# Patient Record
Sex: Male | Born: 1955 | Race: White | Hispanic: No | Marital: Married | Smoking: Former smoker
Health system: Southern US, Community
[De-identification: ages and names within clinical notes are randomized; demographics above are authoritative.]

## PROBLEM LIST (undated history)

## (undated) DIAGNOSIS — K219 Gastro-esophageal reflux disease without esophagitis: Secondary | ICD-10-CM

## (undated) DIAGNOSIS — E079 Disorder of thyroid, unspecified: Secondary | ICD-10-CM

## (undated) DIAGNOSIS — E785 Hyperlipidemia, unspecified: Secondary | ICD-10-CM

## (undated) DIAGNOSIS — T7840XA Allergy, unspecified, initial encounter: Secondary | ICD-10-CM

## (undated) DIAGNOSIS — I10 Essential (primary) hypertension: Secondary | ICD-10-CM

## (undated) DIAGNOSIS — K449 Diaphragmatic hernia without obstruction or gangrene: Secondary | ICD-10-CM

## (undated) DIAGNOSIS — F1910 Other psychoactive substance abuse, uncomplicated: Secondary | ICD-10-CM

## (undated) DIAGNOSIS — N189 Chronic kidney disease, unspecified: Secondary | ICD-10-CM

## (undated) HISTORY — DX: Diaphragmatic hernia without obstruction or gangrene: K44.9

## (undated) HISTORY — PX: TONSILLECTOMY: SUR1361

## (undated) HISTORY — PX: BACK SURGERY: SHX140

## (undated) HISTORY — DX: Hyperlipidemia, unspecified: E78.5

## (undated) HISTORY — DX: Allergy, unspecified, initial encounter: T78.40XA

## (undated) HISTORY — PX: NECK SURGERY: SHX720

## (undated) HISTORY — DX: Chronic kidney disease, unspecified: N18.9

## (undated) HISTORY — DX: Other psychoactive substance abuse, uncomplicated: F19.10

## (undated) HISTORY — PX: HEPARIN ASSOCIATED ANTIBODY DETECTION (CONVERTED LAB): LAB15012

## (undated) HISTORY — DX: Disorder of thyroid, unspecified: E07.9

## (undated) HISTORY — PX: COLONOSCOPY: SHX174

## (undated) HISTORY — DX: Gastro-esophageal reflux disease without esophagitis: K21.9

## (undated) HISTORY — PX: ESOPHAGOGASTRODUODENOSCOPY: SHX1529

---

## 2000-10-30 ENCOUNTER — Encounter (INDEPENDENT_AMBULATORY_CARE_PROVIDER_SITE_OTHER): Payer: Self-pay | Admitting: Specialist

## 2000-10-30 ENCOUNTER — Ambulatory Visit (HOSPITAL_COMMUNITY): Admission: RE | Admit: 2000-10-30 | Discharge: 2000-10-30 | Payer: Self-pay | Admitting: Gastroenterology

## 2008-01-07 ENCOUNTER — Encounter: Admission: RE | Admit: 2008-01-07 | Discharge: 2008-01-07 | Payer: Self-pay | Admitting: Gastroenterology

## 2009-02-22 ENCOUNTER — Encounter: Admission: RE | Admit: 2009-02-22 | Discharge: 2009-02-22 | Payer: Self-pay | Admitting: Gastroenterology

## 2011-01-19 ENCOUNTER — Other Ambulatory Visit: Payer: Self-pay | Admitting: Gastroenterology

## 2011-01-19 DIAGNOSIS — B182 Chronic viral hepatitis C: Secondary | ICD-10-CM

## 2011-02-13 ENCOUNTER — Ambulatory Visit
Admission: RE | Admit: 2011-02-13 | Discharge: 2011-02-13 | Disposition: A | Discharge status: Home / Self Care | Payer: PRIVATE HEALTH INSURANCE | Source: Ambulatory Visit | Attending: Gastroenterology | Admitting: Gastroenterology

## 2011-02-13 DIAGNOSIS — B182 Chronic viral hepatitis C: Secondary | ICD-10-CM

## 2012-03-04 ENCOUNTER — Other Ambulatory Visit: Payer: Self-pay | Admitting: Gastroenterology

## 2012-03-04 DIAGNOSIS — B182 Chronic viral hepatitis C: Secondary | ICD-10-CM

## 2012-03-11 ENCOUNTER — Ambulatory Visit
Admission: RE | Admit: 2012-03-11 | Discharge: 2012-03-11 | Disposition: A | Discharge status: Home / Self Care | Payer: PRIVATE HEALTH INSURANCE | Source: Ambulatory Visit | Attending: Gastroenterology | Admitting: Gastroenterology

## 2012-03-11 DIAGNOSIS — B182 Chronic viral hepatitis C: Secondary | ICD-10-CM

## 2014-04-08 ENCOUNTER — Emergency Department (HOSPITAL_COMMUNITY)
Admission: EM | Admit: 2014-04-08 | Discharge: 2014-04-08 | Disposition: A | Discharge status: Home / Self Care | Payer: Worker's Compensation | Attending: Emergency Medicine | Admitting: Emergency Medicine

## 2014-04-08 ENCOUNTER — Emergency Department (HOSPITAL_COMMUNITY): Payer: Worker's Compensation

## 2014-04-08 ENCOUNTER — Encounter (HOSPITAL_COMMUNITY): Payer: Self-pay | Admitting: Family Medicine

## 2014-04-08 DIAGNOSIS — I10 Essential (primary) hypertension: Secondary | ICD-10-CM | POA: Insufficient documentation

## 2014-04-08 DIAGNOSIS — S199XXA Unspecified injury of neck, initial encounter: Secondary | ICD-10-CM | POA: Diagnosis not present

## 2014-04-08 DIAGNOSIS — S0001XA Abrasion of scalp, initial encounter: Secondary | ICD-10-CM | POA: Diagnosis not present

## 2014-04-08 DIAGNOSIS — Y998 Other external cause status: Secondary | ICD-10-CM | POA: Diagnosis not present

## 2014-04-08 DIAGNOSIS — Y9389 Activity, other specified: Secondary | ICD-10-CM | POA: Diagnosis not present

## 2014-04-08 DIAGNOSIS — Y9289 Other specified places as the place of occurrence of the external cause: Secondary | ICD-10-CM | POA: Diagnosis not present

## 2014-04-08 DIAGNOSIS — Z87891 Personal history of nicotine dependence: Secondary | ICD-10-CM | POA: Insufficient documentation

## 2014-04-08 DIAGNOSIS — W19XXXA Unspecified fall, initial encounter: Secondary | ICD-10-CM

## 2014-04-08 DIAGNOSIS — W11XXXA Fall on and from ladder, initial encounter: Secondary | ICD-10-CM | POA: Insufficient documentation

## 2014-04-08 DIAGNOSIS — S0990XA Unspecified injury of head, initial encounter: Secondary | ICD-10-CM | POA: Diagnosis present

## 2014-04-08 HISTORY — DX: Essential (primary) hypertension: I10

## 2014-04-08 MED ORDER — HYDROCODONE-ACETAMINOPHEN 5-325 MG PO TABS: 1.0000 | ORAL_TABLET | ORAL | Status: DC | PRN

## 2014-04-08 MED ORDER — HYDROCODONE-ACETAMINOPHEN 5-325 MG PO TABS
2.0000 | ORAL_TABLET | Freq: Once | ORAL | Status: AC
  Administered 2014-04-08: 2 via ORAL
  Filled 2014-04-08: qty 2

## 2014-04-08 MED ORDER — CYCLOBENZAPRINE HCL 10 MG PO TABS: 10.0000 mg | ORAL_TABLET | Freq: Two times a day (BID) | ORAL | Status: DC | PRN

## 2014-04-08 NOTE — Discharge Instructions (Signed)
Please follow the directions provided.  Use the referral or the resource guide provided to establish care with a primary care provider for a follow-up appointment to ensure you are getting better.  Take your pain medicine as needed and rest.  You will probably be more sore tomorrow. Keep your wounds clean and dry.  They are superficial and should heal well. However, don't hesitate to return for any new, worsening or concerning symptoms.     WHEN SHOULD I SEEK IMMEDIATE MEDICAL CARE?  You should get help right away if:  You have confusion or drowsiness.  You feel sick to your stomach (nauseous) or have continued, forceful vomiting.  You have dizziness or unsteadiness that is getting worse.  You have severe, continued headaches not relieved by medicine. Only take over-the-counter or prescription medicines for pain, fever, or discomfort as directed by your health care provider.  You do not have normal function of the arms or legs or are unable to walk.  You notice changes in the black spots in the center of the colored part of your eye (pupil).  You have a clear or bloody fluid coming from your nose or ears.  You have a loss of vision. During the next 24 hours after the injury, you must stay with someone who can watch you for the warning signs. This person should contact local emergency services (911 in the U.[place] if you have seizures, you become unconscious, or you are unable to wake up.   Emergency Department Resource Guide 1) Find a Doctor and Pay Out of Pocket Although you won't have to find out who is covered by your insurance plan, it is a good idea to ask around and get recommendations. You will then need to call the office and see if the doctor you have chosen will accept you as a new patient and what types of options they offer for patients who are self-pay. Some doctors offer discounts or will set up payment plans for their patients who do not have insurance, but you will need to ask so you  aren't surprised when you get to your appointment.  2) Contact Your Local Health Department Not all health departments have doctors that can see patients for sick visits, but many do, so it is worth a call to see if yours does. If you don't know where your local health department is, you can check in your phone book. The CDC also has a tool to help you locate your state's health department, and many state websites also have listings of all of their local health departments.  3) Find a Walk-in Clinic If your illness is not likely to be very severe or complicated, you may want to try a walk in clinic. These are popping up all over the country in pharmacies, drugstores, and shopping centers. They're usually staffed by nurse practitioners or physician assistants that have been trained to treat common illnesses and complaints. They're usually fairly quick and inexpensive. However, if you have serious medical issues or chronic medical problems, these are probably not your best option.  No Primary Care Doctor: - Call Health Connect at  [phone] - they can help you locate a primary care doctor that  accepts your insurance, provides certain services, etc. - Physician Referral Service- [phone]  Chronic Pain Problems: Organization         Address  Phone   Notes  [facility]  [phone]) [phone] Patients need to be referred by their primary care  doctor.   Medication Assistance: Organization         Address  Phone   Notes  [facility] Medication [facility] [address] [place] [place], [place] [address] [phone] --Must be a resident of [facility] -- Must have NO insurance coverage whatsoever (no Medicaid/ Medicare, etc.) -- The pt. MUST have a primary care doctor that directs their care regularly and follows them in the community   Med[facility]ist  [phone]   [employer]  [phone]    Agencies that provide inexpensive medical care: Organization          Address  Phone   Notes  [facility]  [phone]   [facility]    [phone]   [facility] [address] [place], [place] [address] [phone]   [facility] [address], [place] [phone]   Planned Parenthood    [phone]   [facility]    [phone]   [facility]  [address], [place] Phone:  [phone], Fax:  [phone] Hours of Operation:  9 am - 6 pm, M-F.  Also accepts Medicaid/Medicare and self-pay.  [facility]  [address], [place] Phone: [phone], Fax: [phone]. Hours of Operation:  8:30 am - 5:30 pm, M-F.  Also accepts Medicaid and self-pay.  [facility] [place] [address] [place] Phone: [phone]   [facility] [address] Natasha Bence [place], [place] [phone], Ext. [phone] Mondays & Thursdays: 7-9 AM.  First 15 patients are seen on a first come, first serve basis.    Medicaid-accepting [facility] Providers:  Organization         Address  Phone   Notes  [facility] [address], [place] [phone] Also accepts self-pay patients.  [facility] [address] Laurell Josephs [place], [place]  [phone]   [facility] [address], [place] [phone]   [facility] [address], [place] [phone]   Renaye Rakers [address], [place]   [phone] Only accepts [place] Access [place] patients after they have their name applied to their card.   Self-Pay (no insurance) in [facility]:  Organization         Address  Phone   Notes  Sickle Cell Patients, [facility] [address] [place], [place] [phone]   [facility] [address] [place], [place] [phone]    [facility] [place]  [address], [place] [phone]   [facility]/Dr. Osei-Bonsu  [address], [place] or [address], [place] [phone] Phone number for both [place] and [place] locations is the same.  [facility] [address] [place] [phone]   [facility] [address] [place] or [address] [phone]   [facility] [address], [place] [phone], phone; [phone], fax Sees patients 1st and 3rd Saturday of every month.  Must not qualify for public or private insurance (i.e. Medicaid, Medicare, [place] Health Choice, Veterans' Benefits)  Household income should be no more than 200% of the  poverty level The clinic cannot treat you if you are pregnant or think you are pregnant  Sexually transmitted diseases are not treated at the clinic.    Dental Care: Organization         Address  Phone  Notes  [facility] [address], [place] [phone]) [phone] Accepts children up to age 59 who are enrolled in [place] or [place] Health Choice; pregnant women with a Medicaid card; and children who have applied for Medicaid or [place] Health Choice, but were declined, whose parents can pay a reduced fee at time of service.  [facility] [place]  [address], [place] [phone]) [phone] Accepts children up to age 59 who are enrolled in [place] or [place] Health Choice; pregnant women with a Medicaid card; and children who have applied for Medicaid or [place] Health Choice, but were declined, whose parents can pay a reduced fee at time of service.  [facility]  [address], [place] [phone]) [phone] Patients are seen by appointment only. Walk-ins are not accepted. [facility] will see patients 5618  years of age and older. Monday - Tuesday (8am-5pm) Most Wednesdays (8:30-5pm) $30 per visit, cash only  [facility]  [address], [facility] [phone]) [phone] Patients are seen by appointment only. Walk-ins are not accepted. [facility] will see patients 59 years of age and older. One Wednesday Evening (Monthly: Volunteer Based).  $30 per visit, cash only  [facility] [facility]  [phone] for adults; Children under age 464, call Graduate Pediatric Dentistry at [phone]. Children aged 324-14, please call [phone]) [phone] to request a pediatric application.  Dental services are provided in all areas of dental care including fillings, crowns and bridges, complete and partial dentures, implants, gum treatment, root canals, and extractions. Preventive care is also provided. Treatment is provided to both adults and children. Patients are selected via a lottery and there is often a waiting list.   [facility] [address], [place]  [phone] [email or web address]   [facility] [address], [place], [place] [phone], Ext. [phone] Second and Fourth Thursday of each month, opens at 6:30 AM; Clinic ends at 9 AM.  Patients are seen on a first-come first-served basis, and a limited number are seen during each clinic.   [facility]  [address], [place], [place] [phone]   Eligibility Requirements You must have lived in [place], or [place] counties for at least the last three months.   You cannot be eligible for state or federal sponsored [place]healthcare insurance, including CIGNAVeterans Administration, [place]icaid, or [employer]icare.   You generally cannot be eligible for healthcare insurance through your employer.    How to apply: Eligibility screenings are held every Tuesday and Wednesday afternoon from 1:00 pm until 4:00 pm. You do not need an appointment for the interview!  [facility]  [address], [place], [place] [phone]   [facility]  [phone]   [facility]  [phone]   [facility]  [phone]    Behavioral Health Resources in the Community: Intensive Outpatient Programs Organization         Address  Phone  Notes  [facility] [place] [place] [facility] [address], [place], [place] [phone]   [facility] [address] [place], [place] [phone]   [facility] [address], [place], [place]  [phone]   [facility] [address]  [place], [place] [phone] or [phone]   Substance Abuse Resources Organization         Address  Phone  Notes  [facility] Drug Services  [phone]   [facility]  [phone]   [facility] [place]  [phone]   Floydene Flock  [phone]   Residential & Outpatient Substance Abuse Program  [phone]   Psychological Services Organization         Address  Phone  Notes  [facility]  [phone]   [facility]  [phone]   [facility] [address], [place] [phone] or [phone]    Mobile Crisis Teams Organization         Address  Phone  Notes  [facility], Mobile Crisis Care Unit  [phone]   [facility]  [address] [place], [place] [phone]   Doristine Locks [address] [place] [phone]    Self-Help/Support Groups Organization         Address  Phone             Notes  [facility] [place] - variety of support groups  [phone]- I7437963 Call for more information  Narcotics Anonymous (NA), [facility] [address], [employer] [place]  2 meetings at this location   Statistician         Address  Phone  Notes  [facility] [address],    [place]   [phone]   [facility]  [address] [place] [address], [place], [place] [phone]   [facility] [address] [place], [place] [phone] Admissions: 8am-3pm M-F  [facility] [address],    [place], [place] [phone]   [facility] [address] [place], [place], [place] [phone]   [facility] [address]  [place], [place] [phone]   [facility] - Intensive Outpatient [address] Laurell Josephs [address], [place], [place] [phone]   [facility] ([facility] [address] [place].,  [place], [place] [phone] or [phone]   [facility] ([facility]) [address] [place], [place] [phone] Accepts Medicaid  [facility] [address]  [place] [phone] Substance Abuse/Addiction Treatment   [facility] Organization         Address  Phone  Notes  [facility]  [phone]   Angie Fava, PhD [address] Ervin Knack [place], [place]   [phone] or [phone]   [facility]   [address] [place], [place] [phone]   [facility] [address], [place], [place] [phone] Insurance/Medicaid/sponsorship through [facility] and Families [address]                                    [place], [place] [phone] Therapy/tele-psych/case  [facility] [address], [place] [phone]    Dr. Lolly Mustache  [phone]   [facility]  [facility] Dept. 1) [address] [place] 2) [address] [place] 3)  [address], [place] [phone])  [phone]) [phone]  [phone](336) [phone]   [place] [facility] [place] Child Abuse Hotline [phone]) [phone] or [phone] (After Hours)

## 2014-04-08 NOTE — ED Provider Notes (Signed)
CSN: [id number]     Arrival date & time 04/08/14  1216 History   First MD Initiated Contact with Patient 04/08/14 1342     Chief Complaint  Patient presents with  . Fall  . Headache  . Neck Pain   (Consider location/radiation/quality/duration/timing/severity/associated sxs/prior Treatment) HPI  Mathew Wagner is a 58 yo male presenting with report of fall from ladder this am.  He states appr 2 hrs PTA he was standing near the top of a 10 foot ladder changing a light.  He states he tried to moves his feet over to reach farther and he fell from the ladder. Hitting his head and the fifth finger on his left hand on the ladder before landing on the floor.  He currently complains of neck pain and a mild frontal headache. He describes his pain as aching and rates it as 8/10.  He denies any light-headedness, chest pain, focal neuro deficit or LOC before or after the fall.  Past Medical History  Diagnosis Date  . Hypertension    Past Surgical History  Procedure Laterality Date  . Back surgery    . Tonsillectomy     History reviewed. No pertinent family history. History  Substance Use Topics  . Smoking status: Former Games developermoker  . Smokeless tobacco: Not on file  . Alcohol Use: Not on file    Review of Systems  Constitutional: Negative for fever and chills.  HENT: Negative for sore throat.   Eyes: Negative for visual disturbance.  Respiratory: Negative for cough and shortness of breath.   Cardiovascular: Negative for chest pain and leg swelling.  Gastrointestinal: Negative for nausea, vomiting and diarrhea.  Genitourinary: Negative for dysuria.  Musculoskeletal: Positive for myalgias.  Skin: Positive for wound. Negative for rash.  Neurological: Positive for headaches. Negative for weakness and numbness.    Allergies  Review of patient's allergies indicates no known allergies.  Home Medications   Prior to Admission medications   Not on File   BP 160/99 mmHg  Pulse 117   Temp(Src) 98 F (36.7 C) (Oral)  Resp 16  SpO2 96% Physical Exam  Constitutional: He appears well-developed and well-nourished. No distress.  HENT:  Head: Normocephalic. Head is with abrasion. Head is without raccoon's eyes, without Battle's sign and without contusion.    Mouth/Throat: Oropharynx is clear and moist. No oropharyngeal exudate.  Eyes: Conjunctivae are normal.  Neck: Normal range of motion and full passive range of motion without pain. Neck supple. Muscular tenderness present. No rigidity. No thyromegaly present.    Cardiovascular: Normal rate, regular rhythm and intact distal pulses.   Pulmonary/Chest: Effort normal and breath sounds normal. No respiratory distress. He has no wheezes. He has no rales. He exhibits no tenderness.  Abdominal: Soft. There is no tenderness.  Musculoskeletal: He exhibits no tenderness.       Hands: Lymphadenopathy:    He has no cervical adenopathy.  Neurological: He is alert.  Skin: Skin is warm and dry. No rash noted. He is not diaphoretic.  Psychiatric: He has a normal mood and affect.  Nursing note and vitals reviewed.   ED Course  Procedures (including critical care time) Labs Review Labs Reviewed - No data to display  Imaging Review Dg Cervical Spine Complete  04/08/2014   CLINICAL DATA:  58 year old male status post fall from ladder 10 feet today. Head bleeding, headache and neck pain. Initial encounter.  EXAM: CERVICAL SPINE  4+ VIEWS  COMPARISON:  None.  FINDINGS: Straightening of cervical  lordosis. Normal prevertebral soft tissue contour. C6-C7 interbody ankylosis. Cervicothoracic junction alignment is within normal limits. Disc space loss and moderate endplate spurring at C5-C6. Bilateral posterior element alignment is within normal limits. Moderate facet hypertrophy at C4-C5 and C5-C6. Negative lung apices. C1-C2 alignment and odontoid within normal limits.  IMPRESSION: 1. No acute fracture or listhesis identified in the cervical  spine. Ligamentous injury is not excluded. 2. 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   Electronically Signed   By: Augusto GambleLee  Hall M.D.   On: 04/08/2014 15:35     EKG Interpretation None      MDM   Final diagnoses:  Fall   58 yo with neck pain and muscle soreness after fall from ladder.  He hit his head with the fall but he has no report of LOC, and other than small abrasions to the the occipital scalp, no other signs of head injury and normal neuro exam.  His c-spine xray is negative for acute abnormality and his pain is managed in the ED.  RICE protocol and pain medicine indicated and discussed with patient.  Pt is well-appearing, in no acute distress and vital signs are stable.  They appear safe to be discharged.  Discharge include resources to establish cere with a PCP for a follow-up.  Return precautions provided. Pt verbalized understanding and in agreement.    Filed Vitals:   04/08/14 1445 04/08/14 1500 04/08/14 1621 04/08/14 1630  BP: 146/85 147/81 128/85 133/73  Pulse: 99 98 91 87  Temp:      TempSrc:      Resp: 16 16 16    SpO2: 97% 96% 99% 95%   Meds given in ED:  Medications  HYDROcodone-acetaminophen (NORCO/VICODIN) 5-325 MG per tablet 2 tablet (2 tablets Oral Given 04/08/14 1419)    Discharge Medication List as of 04/08/2014  4:29 PM    START taking these medications   Details  cyclobenzaprine (FLEXERIL) 10 MG tablet Take 1 tablet (10 mg total) by mouth 2 (two) times daily as needed for muscle spasms., Starting 04/08/2014, Until Discontinued, Print    HYDROcodone-acetaminophen (NORCO/VICODIN) 5-325 MG per tablet Take 1-2 tablets by mouth every 4 (four) hours as needed for moderate pain or severe pain., Starting 04/08/2014, Until Discontinued, Print           Harle BattiestElizabeth Susen Haskew, NP 04/08/14 2109  Flint MelterElliott L [place], MD 04/12/14 [phone]

## 2014-04-08 NOTE — ED Notes (Signed)
Per pt sts fell 8 to 10 feet off a ladder. sts hit head on something not sure. Denies LOC. Pt had some bleeding to head but currently controlled. sts some neck pain and slight HA.

## 2017-02-07 LAB — HM DEXA SCAN: HM Dexa Scan: NORMAL

## 2019-01-02 ENCOUNTER — Telehealth (INDEPENDENT_AMBULATORY_CARE_PROVIDER_SITE_OTHER): Payer: BC Managed Care – PPO | Admitting: Gastroenterology

## 2019-01-02 ENCOUNTER — Other Ambulatory Visit: Payer: Self-pay

## 2019-01-02 VITALS — Ht 72.0 in | Wt 200.0 lb

## 2019-01-02 DIAGNOSIS — R1310 Dysphagia, unspecified: Secondary | ICD-10-CM | POA: Diagnosis not present

## 2019-01-02 DIAGNOSIS — Z86010 Personal history of colonic polyps: Secondary | ICD-10-CM

## 2019-01-02 DIAGNOSIS — K219 Gastro-esophageal reflux disease without esophagitis: Secondary | ICD-10-CM

## 2019-01-02 DIAGNOSIS — R1319 Other dysphagia: Secondary | ICD-10-CM

## 2019-01-02 NOTE — Addendum Note (Signed)
Addended by: Herma Mering D on: 01/02/2019 04:59 PM   Modules accepted: Orders

## 2019-01-02 NOTE — Patient Instructions (Signed)
If you are age 64 or older, your body mass index should be between 23-30. Your Body mass index is 27.12 kg/m. If this is out of the aforementioned range listed, please consider follow up with your Primary Care Provider.  If you are age 61 or younger, your body mass index should be between 19-25. Your Body mass index is 27.12 kg/m. If this is out of the aformentioned range listed, please consider follow up with your Primary Care Provider.   To help prevent the possible spread of infection to our patients, communities, and staff; we will be implementing the following measures:  As of now we are not allowing any visitors/family members to accompany you to any upcoming appointments with [facility]. If you have any concerns about this please contact our office to discuss prior to the appointment.   We have sent the following medications to your pharmacy for you to pick up at your convenience: Protonix 40mg  daily 30 minutes before breakfast.  Your provider has requested that you go to the basement level for lab work at our [place] location ([address] [place] [place] [address]) . Press "B" on the elevator. The lab is located at the first door on the left as you exit the elevator. You may go at whatever time is convienent for you. The current hours of operations are Monday- Friday 7:30am-4:30pm.  You have been scheduled for a Barium Esophogram at [facility] (1st floor of the hospital) on 01/07/2019 at 11:00am. Please arrive 15 minutes prior to your appointment for registration. Make certain not to have anything to eat or drink 3 hours prior to your test. If you need to reschedule for any reason, please contact radiology at [phone] to do so. __________________________________________________________________ A barium swallow is an examination that concentrates on views of the esophagus. This tends to be a double contrast exam (barium and two liquids which, when combined, create a  gas to distend the wall of the oesophagus) or single contrast (non-ionic iodine based). The study is usually tailored to your symptoms so a good history is essential. Attention is paid during the study to the form, structure and configuration of the esophagus, looking for functional disorders (such as aspiration, dysphagia, achalasia, motility and reflux) EXAMINATION You may be asked to change into a gown, depending on the type of swallow being performed. A radiologist and radiographer will perform the procedure. The radiologist will advise you of the type of contrast selected for your procedure and direct you during the exam. You will be asked to stand, sit or lie in several different positions and to hold a small amount of fluid in your mouth before being asked to swallow while the imaging is performed .In some instances you may be asked to swallow barium coated marshmallows to assess the motility of a solid food bolus. The exam can be recorded as a digital or video fluoroscopy procedure. POST PROCEDURE It will take 1-2 days for the barium to pass through your system. To facilitate this, it is important, unless otherwise directed, to increase your fluids for the next 24-48hrs and to resume your normal diet.  This test typically takes about 30 minutes to perform. __________________________________________________________________________________  It has been recommended to you by your physician that you have a(n) EGD at [facility] completed. Per your request, we did not schedule the procedure(s) today. Please contact our office at [phone] to schedule in November 2020.  Thank you,  Dr. Jackquline Denmark

## 2019-01-02 NOTE — Progress Notes (Signed)
Chief Complaint: Dysphagia  Referring Provider: Gay Filler Davis/Dr. Cox      ASSESSMENT AND PLAN;  #1. GERD with esophageal dysphagia. D/d includes eso stricture, Schatzki's ring, motility disorder, eosinophilic esophagitis, pill induced esophagitis, r/o esophageal carcinoma or extrinsic lesions.  #2. H/O polyps (colon by Dr Earlean Shawl ?2013)  #3. ?Chronic Hep C (seen by Dr Earlean Shawl in past).  Plan: -Protonix 40 mg p.o. QD, 1/2 hr before breakfast. #30.  11 refills. -Barium swallow with barium tablet (get lat films as well) -EGD with eso bx and possibly dil and colon Nov (wants 8 AM anyday oct end).  Discussed risks and benefits including small but definite risks of eso perforation, bleeding, risks of anesthesia.  The benefits were also discussed. Consent forms given. -I have instructed patient to chew foods especially meats and breads well and eat slowly. -Please obtain previous records. (Dr Earlean Shawl) -Check CBC, CMP, Hep C geotype and viral load -FU after the above.  Thereafter he would need US abdomen and likely [place] screening.      HPI:    Mathew Wagner is a 63 y.o. male  Seen as an emergency workin Has been having dysphagia mostly to solids x 6 months, intermittent, not getting any worse, associated with heartburn, food getting hung up in the upper chest.  No history of self-induced vomiting.  No history of endoscopic disimpaction. Denies having any weight loss No melena or hematochezia.  Denies having any lower GI symptoms including diarrhea or constipation.  He underwent colonoscopy likely in 2013 when he had small polyps removed.  He was told that he was on a "five-year plan".  No fever chills or night sweats.  No nausea vomiting.  Wife having Sx knee sept 29.  Hence, he would like to wait for endoscopic procedures. Mathew Wagner is his brother Past Medical History:  Diagnosis Date  . GERD (gastroesophageal reflux disease)   . Hypertension   . Thyroid disease     Past  Surgical History:  Procedure Laterality Date  . BACK SURGERY     L4 and L5 possibly  . COLONOSCOPY     Dr Earlean Shawl- 2013  . ESOPHAGOGASTRODUODENOSCOPY     Dr Lyndel Safe- possibly [phone]  . NECK SURGERY     1990's.   . TONSILLECTOMY      Family History  Problem Relation Age of Onset  . Lung cancer Father   . Lung cancer Sister   . Colon cancer Neg Hx   . Esophageal cancer Neg Hx     Social History   Tobacco Use  . Smoking status: Former Research scientist (life sciences)  . Smokeless tobacco: Never Used  . Tobacco comment: quit about 20 years ago  Substance Use Topics  . Alcohol use: Not Currently  . Drug use: Not Currently    Current Outpatient Medications  Medication Sig Dispense Refill  . Acetaminophen (TYLENOL PO) Take 1 tablet by mouth as needed.    Marland Kitchen amLODipine (NORVASC) 10 MG tablet Take 10 mg by mouth daily.    Marland Kitchen amLODipine (NORVASC) 5 MG tablet Take 5 mg by mouth daily.  5  . hydrochlorothiazide (HYDRODIURIL) 25 MG tablet 1 tablet daily.    Marland Kitchen levothyroxine (SYNTHROID) 150 MCG tablet 1 tablet daily.    Marland Kitchen lisinopril (ZESTRIL) 10 MG tablet Take 10 mg by mouth daily.    . pantoprazole (PROTONIX) 40 MG tablet Take 40 mg by mouth daily.     No current facility-administered medications for this visit.     No  Known Allergies  Review of Systems:  Constitutional: Denies fever, chills, diaphoresis, appetite change and fatigue.  HEENT: Denies photophobia, eye pain, redness, hearing loss, ear pain, congestion, sore throat, rhinorrhea, sneezing, mouth sores, neck pain, neck stiffness and tinnitus.   Respiratory: Denies SOB, DOE, cough, chest tightness,  and wheezing.   Cardiovascular: Denies chest pain, palpitations and leg swelling.  Genitourinary: Denies dysuria, urgency, frequency, hematuria, flank pain and difficulty urinating.  Musculoskeletal: Denies myalgias, back pain, joint swelling, arthralgias and gait problem.  Skin: No rash.  Neurological: Denies dizziness, seizures, syncope, weakness,  light-headedness, numbness and headaches.  Hematological: Denies adenopathy. Easy bruising, personal or family bleeding history  Psychiatric/Behavioral: No anxiety or depression     Physical Exam:    Ht 6' (1.829 m)   Wt 200 lb (90.7 kg)   BMI 27.12 kg/m  Filed Weights   01/02/19 0823  Weight: 200 lb (90.7 kg)  Tele-visit  Data Reviewed: I have personally reviewed following labs and imaging studies This service was provided via telemedicine.  The patient was located at home.  The provider was located in office.  The patient did consent to this telephone visit and is aware of possible charges through their insurance for this visit.  The patient was referred by Gerre PebblesSally Davis.   Time spent on call/coordination of care: 30 min  Discussed with Gerre PebblesSally Davis over the phone as well.    Edman Circleaj [name], MD 01/02/2019, 3:28 PM  Cc: Kennon RoundsSally Davis/Dr Cox

## 2019-01-07 ENCOUNTER — Ambulatory Visit (HOSPITAL_COMMUNITY): Payer: BC Managed Care – PPO

## 2019-01-13 ENCOUNTER — Telehealth: Payer: Self-pay | Admitting: Gastroenterology

## 2019-01-13 ENCOUNTER — Other Ambulatory Visit: Payer: Self-pay

## 2019-01-13 ENCOUNTER — Ambulatory Visit (HOSPITAL_COMMUNITY): Admission: RE | Admit: 2019-01-13 | Payer: BC Managed Care – PPO | Source: Ambulatory Visit

## 2019-01-13 MED ORDER — PANTOPRAZOLE SODIUM 40 MG PO TBEC: 40.0000 mg | DELAYED_RELEASE_TABLET | Freq: Every day | ORAL | 11 refills | Status: DC

## 2019-01-13 NOTE — Telephone Encounter (Signed)
Please see previous message

## 2019-01-13 NOTE — Telephone Encounter (Signed)
I have called and left message for patient to return my call.  

## 2019-01-14 NOTE — Telephone Encounter (Signed)
Pt returned your call, pls call him again. °

## 2019-01-16 NOTE — Telephone Encounter (Signed)
I have spoke to patient and let him know that I have sent the prescription to his pharmacy. I have also given him the number to call and reschedule his barrium swallow.

## 2019-01-16 NOTE — Telephone Encounter (Signed)
I have called patient back and left message for him to return my call.

## 2019-01-27 ENCOUNTER — Ambulatory Visit (HOSPITAL_COMMUNITY)
Admission: RE | Admit: 2019-01-27 | Discharge: 2019-01-27 | Disposition: A | Discharge status: Home / Self Care | Payer: BC Managed Care – PPO | Source: Ambulatory Visit | Attending: Gastroenterology | Admitting: Gastroenterology

## 2019-01-27 ENCOUNTER — Other Ambulatory Visit: Payer: Self-pay

## 2019-01-27 DIAGNOSIS — Z86010 Personal history of colonic polyps: Secondary | ICD-10-CM | POA: Diagnosis present

## 2019-01-27 DIAGNOSIS — K219 Gastro-esophageal reflux disease without esophagitis: Secondary | ICD-10-CM | POA: Diagnosis present

## 2019-01-27 DIAGNOSIS — R1319 Other dysphagia: Secondary | ICD-10-CM

## 2019-01-27 DIAGNOSIS — R1310 Dysphagia, unspecified: Secondary | ICD-10-CM | POA: Diagnosis present

## 2019-02-13 DIAGNOSIS — Z1159 Encounter for screening for other viral diseases: Secondary | ICD-10-CM

## 2019-03-19 ENCOUNTER — Other Ambulatory Visit: Payer: Self-pay | Admitting: Gastroenterology

## 2019-03-19 ENCOUNTER — Ambulatory Visit (INDEPENDENT_AMBULATORY_CARE_PROVIDER_SITE_OTHER): Payer: BC Managed Care – PPO

## 2019-03-19 DIAGNOSIS — Z1159 Encounter for screening for other viral diseases: Secondary | ICD-10-CM

## 2019-03-20 LAB — SARS CORONAVIRUS 2 (TAT 6-24 HRS): SARS Coronavirus 2: NEGATIVE

## 2019-03-21 ENCOUNTER — Encounter: Payer: BC Managed Care – PPO | Admitting: Gastroenterology

## 2019-03-24 ENCOUNTER — Ambulatory Visit (AMBULATORY_SURGERY_CENTER): Payer: BC Managed Care – PPO | Admitting: Gastroenterology

## 2019-03-24 ENCOUNTER — Other Ambulatory Visit: Payer: Self-pay

## 2019-03-24 ENCOUNTER — Encounter: Payer: Self-pay | Admitting: Gastroenterology

## 2019-03-24 VITALS — BP 113/73 | HR 78 | Temp 97.8°F | Resp 20 | Ht 72.0 in | Wt 200.0 lb

## 2019-03-24 DIAGNOSIS — K449 Diaphragmatic hernia without obstruction or gangrene: Secondary | ICD-10-CM | POA: Diagnosis not present

## 2019-03-24 DIAGNOSIS — R1310 Dysphagia, unspecified: Secondary | ICD-10-CM

## 2019-03-24 DIAGNOSIS — K219 Gastro-esophageal reflux disease without esophagitis: Secondary | ICD-10-CM

## 2019-03-24 DIAGNOSIS — R1319 Other dysphagia: Secondary | ICD-10-CM

## 2019-03-24 MED ORDER — SODIUM CHLORIDE 0.9 % IV SOLN: 500.0000 mL | Freq: Once | INTRAVENOUS | Status: DC

## 2019-03-24 MED ORDER — PANTOPRAZOLE SODIUM 40 MG PO TBEC: 40.0000 mg | DELAYED_RELEASE_TABLET | Freq: Two times a day (BID) | ORAL | 3 refills | Status: DC

## 2019-03-24 NOTE — Progress Notes (Signed)
PT taken to PACU. Monitors in place. VSS. Report given to RN. 

## 2019-03-24 NOTE — Progress Notes (Signed)
Called to room to assist during endoscopic procedure.  Patient ID and intended procedure confirmed with present staff. Received instructions for my participation in the procedure from the performing physician.  

## 2019-03-24 NOTE — Patient Instructions (Signed)
Handouts given :  Post esophogeal dilation diet, Hiatal hernia                                  Start protonix 40 mg twice daily   YOU HAD AN ENDOSCOPIC PROCEDURE TODAY AT [facility]:   Refer to the procedure report that was given to you for any specific questions about what was found during the examination.  If the procedure report does not answer your questions, please call your gastroenterologist to clarify.  If you requested that your care partner not be given the details of your procedure findings, then the procedure report has been included in a sealed envelope for you to review at your convenience later.  YOU SHOULD EXPECT: Some feelings of bloating in the abdomen. Passage of more gas than usual.  Walking can help get rid of the air that was put into your GI tract during the procedure and reduce the bloating. If you had a lower endoscopy (such as a colonoscopy or flexible sigmoidoscopy) you may notice spotting of blood in your stool or on the toilet paper. If you underwent a bowel prep for your procedure, you may not have a normal bowel movement for a few days.  Please Note:  You might notice some irritation and congestion in your nose or some drainage.  This is from the oxygen used during your procedure.  There is no need for concern and it should clear up in a day or so.  SYMPTOMS TO REPORT IMMEDIATELY:   Following lower endoscopy (colonoscopy or flexible sigmoidoscopy):  Excessive amounts of blood in the stool  Significant tenderness or worsening of abdominal pains  Swelling of the abdomen that is new, acute  Fever of 100F or higher   Following upper endoscopy (EGD)  Vomiting of blood or coffee ground material  New chest pain or pain under the shoulder blades  Painful or persistently difficult swallowing  New shortness of breath  Fever of 100F or higher  Black, tarry-looking stools  For urgent or emergent issues, a gastroenterologist can be reached at any  hour by calling [phone].   DIET:  We do recommend a small meal at first, but then you may proceed to your regular diet.  Drink plenty of fluids but you should avoid alcoholic beverages for 24 hours.  ACTIVITY:  You should plan to take it easy for the rest of today and you should NOT DRIVE or use heavy machinery until tomorrow (because of the sedation medicines used during the test).    FOLLOW UP: Our staff will call the number listed on your records 48-72 hours following your procedure to check on you and address any questions or concerns that you may have regarding the information given to you following your procedure. If we do not reach you, we will leave a message.  We will attempt to reach you two times.  During this call, we will ask if you have developed any symptoms of COVID 19. If you develop any symptoms (ie: fever, flu-like symptoms, shortness of breath, cough etc.) before then, please call [phone].  If you test positive for Covid 19 in the 2 weeks post procedure, please call and report this information to [place].    If any biopsies were taken you will be contacted by phone or by letter within the next 1-3 weeks.  Please call us at [phone] if you have  not heard about the biopsies in 3 weeks.    SIGNATURES/CONFIDENTIALITY: You and/or your care partner have signed paperwork which will be entered into your electronic medical record.  These signatures attest to the fact that that the information above on your After Visit Summary has been reviewed and is understood.  Full responsibility of the confidentiality of this discharge information lies with you and/or your care-partner.

## 2019-03-24 NOTE — Op Note (Signed)
[facility] Patient Name: Mathew Wagner Procedure Date: 03/24/2019 10:01 AM MRN: [id number] Endoscopist: Lynann Bologna , MD Age: 63 Referring MD:  Date of Birth: Jul 04, 1955 Gender: Male Account #: [id number] Procedure:                Upper GI endoscopy Indications:              Dysphagia with barium swallow showing small hiatal                            hernia. Medicines:                Monitored Anesthesia Care Procedure:                Pre-Anesthesia Assessment:                           - Prior to the procedure, a History and Physical                            was performed, and patient medications and                            allergies were reviewed. The patient's tolerance of                            previous anesthesia was also reviewed. The risks                            and benefits of the procedure and the sedation                            options and risks were discussed with the patient.                            All questions were answered, and informed consent                            was obtained. Prior Anticoagulants: The patient has                            taken no previous anticoagulant or antiplatelet                            agents. ASA Grade Assessment: II - A patient with                            mild systemic disease. After reviewing the risks                            and benefits, the patient was deemed in                            satisfactory condition to undergo the procedure.  After obtaining informed consent, the endoscope was                            passed under direct vision. Throughout the                            procedure, the patient's blood pressure, pulse, and                            oxygen saturations were monitored continuously. The                            Endoscope was introduced through the mouth, and                            advanced to the second part of duodenum. The  upper                            GI endoscopy was accomplished without difficulty.                            The patient tolerated the procedure well. Scope In: Scope Out: Findings:                 Mid and distal esophagus was mildly tortuous with                            Z-line at 38 cm. Few healed erosions were noted.                            Biopsies were obtained from the proximal and distal                            esophagus with cold forceps for histology to r/o                            eosinophilic esophagitis. No definite esophageal                            strictures. Dilation was performed with a Maloney                            dilator with no resistance at 50 Fr and mild                            resistance at 52 Fr.                           A small transient hiatal hernia was present.                           The entire examined stomach was normal. Biopsies  were taken with a cold forceps for histology.                           The examined duodenum was normal except for                            incidental small duodenal diverticulum in the                            second portion of the duodenum. Complications:            No immediate complications. Estimated Blood Loss:     Estimated blood loss: none. Impression:               -Presbyesophagus s/p esophageal dilatation.                           -Healed distal esophageal erosions.                           -Small transient hiatal hernia. Recommendation:           - Patient has a contact number available for                            emergencies. The signs and symptoms of potential                            delayed complications were discussed with the                            patient. Return to normal activities tomorrow.                            Written discharge instructions were provided to the                            patient.                           - Post  dilatation diet.                           - Protonix 40 mg p.o. twice daily x 8 weeks, then                            once a day to continue (#90, 2 refills)                           - Await pathology results.                           - Return to GI clinic in 12 weeks.                           - He would like to hold off on colonoscopy at the  present time. He will let us know when he decides.                           - D/W [name]. Lynann Bologna, MD 03/24/2019 10:29:15 AM This report has been signed electronically.

## 2019-03-26 ENCOUNTER — Telehealth: Payer: Self-pay | Admitting: *Deleted

## 2019-03-26 NOTE — Telephone Encounter (Signed)
  Follow up Call-  Call back number 03/24/2019  Post procedure Call Back phone  # [phone]  Permission to leave phone message Yes  Some recent data might be hidden     Patient questions:  Do you have a fever, pain , or abdominal swelling? No. Pain Score  0 *  Have you tolerated food without any problems? Yes.    Have you been able to return to your normal activities? Yes.    Do you have any questions about your discharge instructions: Diet   No. Medications  No. Follow up visit  No.  Do you have questions or concerns about your Care? No.  Actions: * If pain score is 4 or above: No action needed, pain <4.  1. Have you developed a fever since your procedure? no  2.   Have you had an respiratory symptoms (SOB or cough) since your procedure? no  3.   Have you tested positive for COVID 19 since your procedure no  4.   Have you had any family members/close contacts diagnosed with the COVID 19 since your procedure?  no   If yes to any of these questions please route to Joylene John, RN and Alphonsa Gin, Therapist, sports.

## 2019-03-30 ENCOUNTER — Encounter: Payer: Self-pay | Admitting: Gastroenterology

## 2019-03-31 NOTE — Telephone Encounter (Signed)
  Follow up Call-  Call back number 03/24/2019  Post procedure Call Back phone  # [phone]  Permission to leave phone message Yes  Some recent data might be hidden     Patient questions:  No answer. No message left.

## 2019-07-02 ENCOUNTER — Ambulatory Visit: Payer: BC Managed Care – PPO | Admitting: Physician Assistant

## 2019-07-02 ENCOUNTER — Other Ambulatory Visit: Payer: Self-pay

## 2019-07-02 ENCOUNTER — Encounter: Payer: Self-pay | Admitting: Physician Assistant

## 2019-07-02 VITALS — BP 136/74 | HR 84 | Temp 96.8°F | Resp 16 | Wt 201.0 lb

## 2019-07-02 DIAGNOSIS — I10 Essential (primary) hypertension: Secondary | ICD-10-CM | POA: Insufficient documentation

## 2019-07-02 DIAGNOSIS — K219 Gastro-esophageal reflux disease without esophagitis: Secondary | ICD-10-CM

## 2019-07-02 DIAGNOSIS — E038 Other specified hypothyroidism: Secondary | ICD-10-CM | POA: Diagnosis not present

## 2019-07-02 DIAGNOSIS — Z23 Encounter for immunization: Secondary | ICD-10-CM

## 2019-07-02 NOTE — Assessment & Plan Note (Signed)
labwork pending Continue current meds as directed Follow up 6 months

## 2019-07-02 NOTE — Progress Notes (Signed)
Established Patient Office Visit  Subjective:  Patient ID: Mathew Wagner, male    DOB: 1955-10-11  Age: 64 y.o. MRN: [id number]  CC:  Chief Complaint  Patient presents with  . Follow-up  . Hypothyroidism  . Hypertension    HPI Mathew Wagner presents for follow up hypertension and hypothyroidism  Pt presents for follow up of hypertension.  The patient is tolerating the medication well without side effects. Compliance with treatment has been good; including taking medication as directed , maintains a healthy diet and regular exercise regimen , and following up as directed. Patient was evaluated using exam, physical, labs and other information to perform evidence based decision making.he is currently on hctz 9m qd, amlodipine 136mqd and lisinopril 1014md  Hypothyroidism: Patient presents for evaluation of thyroid function. Symptoms consist of denies fatigue, weight changes, heat/cold intolerance, bowel/skin changes or CVS symptoms.  The problem has been stable.  Previous thyroid studies include TSH.  He is currently on levothyroxine 175m68mnd is overdue for labwork Past Medical History:  Diagnosis Date  . Allergy   . GERD (gastroesophageal reflux disease)   . Hypertension   . Thyroid disease     Past Surgical History:  Procedure Laterality Date  . BACK SURGERY     L4 and L5 possibly  . COLONOSCOPY     Dr MedoEarlean Shawl13  . ESOPHAGOGASTRODUODENOSCOPY     Dr GuptLyndel Safessibly [id number] SURGERY     1990's.   . TONSILLECTOMY      Family History  Problem Relation Age of Onset  . Lung cancer Father   . Lung cancer Sister   . Colon cancer Neg Hx   . Esophageal cancer Neg Hx   . Rectal cancer Neg Hx   . Stomach cancer Neg Hx     Social History   Socioeconomic History  . Marital status: Married    Spouse name: Not on file  . Number of children: 2  . Years of education: Not on file  . Highest education level: Not on file  Occupational History  .  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 Smoking status: Former [employer] (life sciences)Smokeless tobacco: Never Used  . Tobacco comment: quit about 20 years ago  Substance and Sexual Activity  . Alcohol use: Yes    Comment: rare  . Drug use: Not Currently  . Sexual activity: Not on file  Other Topics Concern  . Not on file  Social History Narrative  . Not on file   Social Determinants of Health   Financial Resource Strain:   . Difficulty of Paying Living Expenses: Not on file  Food Insecurity:   . Worried About RunnCharity fundraiserthe Last Year: Not on file  . Ran Out of Food in the Last Year: Not on file  Transportation Needs:   . Lack of Transportation (Medical): Not on file  . Lack of Transportation (Non-Medical): Not on file  Physical Activity:   . Days of Exercise per Week: Not on file  . Minutes of Exercise per Session: Not on file  Stress:   . Feeling of Stress : Not on file  Social Connections:   . Frequency of Communication with Friends and Family: Not on file  . Frequency of Social Gatherings with Friends and Family: Not on file  . Attends Religious Services: Not on file  . Active Member of Clubs or Organizations: Not on file  . Attends Club  or Organization Meetings: Not on file  . Marital Status: Not on file  Intimate Partner Violence:   . Fear of Current or Ex-Partner: Not on file  . Emotionally Abused: Not on file  . Physically Abused: Not on file  . Sexually Abused: Not on file     Current Outpatient Medications:  .  amLODipine (NORVASC) 10 MG tablet, Take 10 mg by mouth daily., Disp: , Rfl:  .  hydrochlorothiazide (HYDRODIURIL) 25 MG tablet, 1 tablet daily., Disp: , Rfl:  .  levothyroxine (SYNTHROID) 175 MCG tablet, Take 175 mcg by mouth daily., Disp: , Rfl:  .  lisinopril (ZESTRIL) 10 MG tablet, Take 10 mg by mouth daily., Disp: , Rfl:  .  pantoprazole (PROTONIX) 40 MG tablet, Take 1 tablet (40 mg total) by mouth 2 (two) times daily., Disp: 90 tablet, Rfl: 3    No Known Allergies  ROS CONSTITUTIONAL: Negative for chills, fatigue, fever, unintentional weight gain and unintentional weight loss.  E/N/T: Negative for ear pain, nasal congestion and sore throat.  CARDIOVASCULAR: Negative for chest pain, dizziness, palpitations and pedal edema.  RESPIRATORY: Negative for recent cough and dyspnea.  GASTROINTESTINAL: Negative for abdominal pain, acid reflux symptoms, constipation, diarrhea, nausea and vomiting.  MSK: Negative for arthralgias and myalgias.  INTEGUMENTARY: Negative for rash.  NEUROLOGICAL: Negative for dizziness and headaches.  PSYCHIATRIC: Negative for sleep disturbance and to question depression screen.  Negative for depression, negative for anhedonia.        Objective:    PHYSICAL EXAM:   VS: BP 136/74   Pulse 84   Temp (!) 96.8 F (36 C)   Resp 16   Wt 201 lb (91.2 kg)   BMI 27.26 kg/m   GEN: Well nourished, well developed, in no acute distress   Cardiac: RRR; no murmurs, rubs, or gallops,no edema - no significant varicosities Respiratory:  normal respiratory rate and pattern with no distress - normal breath sounds with no rales, rhonchi, wheezes or rubs  MS: no deformity or atrophy  Skin: warm and dry, no rash  Neuro:  Alert and Oriented x 3, Strength and sensation are intact - CN II-Xii grossly intact Psych: euthymic mood, appropriate affect and demeanor  BP 136/74   Pulse 84   Temp (!) 96.8 F (36 C)   Resp 16   Wt 201 lb (91.2 kg)   BMI 27.26 kg/m  Wt Readings from Last 3 Encounters:  07/02/19 201 lb (91.2 kg)  03/24/19 200 lb (90.7 kg)  01/02/19 200 lb (90.7 kg)     Health Maintenance Due  Topic Date Due  . HIV Screening  02/13/1970  . TETANUS/TDAP  02/13/1974  . COLONOSCOPY  02/13/2005  . INFLUENZA VACCINE  11/30/2018    There are no preventive care reminders to display for this patient.  No results found for: TSH No results found for: WBC, HGB, HCT, MCV, PLT No results found for: NA, K,  CHLORIDE, CO2, GLUCOSE, BUN, CREATININE, BILITOT, ALKPHOS, AST, ALT, PROT, ALBUMIN, CALCIUM, ANIONGAP, EGFR, GFR No results found for: CHOL No results found for: HDL No results found for: LDLCALC No results found for: TRIG No results found for: CHOLHDL No results found for: HGBA1C    Assessment & Plan:   Problem List Items Addressed This Visit      Cardiovascular and Mediastinum   Essential hypertension, benign - Primary    Continue current meds labwork pending Follow up in 6 months      Relevant Medications   amLODipine (  NORVASC) 10 MG tablet   Other Relevant Orders   CBC with Differential/Platelet   Comprehensive metabolic panel   Lipid panel     Digestive   GERD without esophagitis    Continue current meds as directed        Endocrine   Other specified hypothyroidism    labwork pending Continue current meds as directed Follow up 6 months      Relevant Medications   levothyroxine (SYNTHROID) 175 MCG tablet   Other Relevant Orders   TSH     Other   Need for prophylactic vaccination and inoculation against influenza    flucelvax given      Relevant Orders   Flu Vaccine MDCK QUAD PF      No orders of the defined types were placed in this encounter.   Follow-up: Return in about 6 months (around 01/02/2020) for fasting follow up.    SARA R DAVIS, PA-C

## 2019-07-02 NOTE — Assessment & Plan Note (Signed)
Continue current meds labwork pending Follow up in 6 months 

## 2019-07-02 NOTE — Assessment & Plan Note (Signed)
Continue current meds as directed 

## 2019-07-02 NOTE — Assessment & Plan Note (Signed)
flucelvax given 

## 2019-07-03 LAB — COMPREHENSIVE METABOLIC PANEL
ALT: 37 IU/L (ref 0–44)
AST: 33 IU/L (ref 0–40)
Albumin/Globulin Ratio: 1.9 (ref 1.2–2.2)
Albumin: 4.3 g/dL (ref 3.8–4.8)
Alkaline Phosphatase: 93 IU/L (ref 39–117)
BUN/Creatinine Ratio: 18 (ref 10–24)
BUN: 18 mg/dL (ref 8–27)
Bilirubin Total: 0.9 mg/dL (ref 0.0–1.2)
CO2: 26 mmol/L (ref 20–29)
Calcium: 9.2 mg/dL (ref 8.6–10.2)
Chloride: 100 mmol/L (ref 96–106)
Creatinine, Ser: 0.99 mg/dL (ref 0.76–1.27)
GFR calc Af Amer: 93 mL/min/{1.73_m2} (ref >59)
GFR calc non Af Amer: 80 mL/min/{1.73_m2} (ref >59)
Globulin, Total: 2.3 g/dL (ref 1.5–4.5)
Glucose: 95 mg/dL (ref 65–99)
Potassium: 4.1 mmol/L (ref 3.5–5.2)
Sodium: 142 mmol/L (ref 134–144)
Total Protein: 6.6 g/dL (ref 6.0–8.5)

## 2019-07-03 LAB — CBC WITH DIFFERENTIAL/PLATELET
Basophils Absolute: 0.1 10*3/uL (ref 0.0–0.2)
Basos: 1 %
EOS (ABSOLUTE): 0.4 10*3/uL (ref 0.0–0.4)
Eos: 6 %
Hematocrit: 40.5 % (ref 37.5–51.0)
Hemoglobin: 13.6 g/dL (ref 13.0–17.7)
Immature Grans (Abs): 0 10*3/uL (ref 0.0–0.1)
Immature Granulocytes: 0 %
Lymphocytes Absolute: 1.7 10*3/uL (ref 0.7–3.1)
Lymphs: 25 %
MCH: 30.7 pg (ref 26.6–33.0)
MCHC: 33.6 g/dL (ref 31.5–35.7)
MCV: 91 fL (ref 79–97)
Monocytes Absolute: 0.6 10*3/uL (ref 0.1–0.9)
Monocytes: 8 %
Neutrophils Absolute: 4.1 10*3/uL (ref 1.4–7.0)
Neutrophils: 60 %
Platelets: 264 10*3/uL (ref 150–450)
RBC: 4.43 x10E6/uL (ref 4.14–5.80)
RDW: 12.8 % (ref 11.6–15.4)
WBC: 6.8 10*3/uL (ref 3.4–10.8)

## 2019-07-03 LAB — LIPID PANEL
Chol/HDL Ratio: 4.3 ratio (ref 0.0–5.0)
Cholesterol, Total: 146 mg/dL (ref 100–199)
HDL: 34 mg/dL — ABNORMAL LOW (ref >39)
LDL Chol Calc (NIH): 92 mg/dL (ref 0–99)
Triglycerides: 110 mg/dL (ref 0–149)
VLDL Cholesterol Cal: 20 mg/dL (ref 5–40)

## 2019-07-03 LAB — CARDIOVASCULAR RISK ASSESSMENT

## 2019-07-03 LAB — TSH: TSH: 0.952 u[IU]/mL (ref 0.450–4.500)

## 2019-07-14 ENCOUNTER — Other Ambulatory Visit: Payer: Self-pay | Admitting: Physician Assistant

## 2019-10-02 ENCOUNTER — Other Ambulatory Visit: Payer: Self-pay | Admitting: Physician Assistant

## 2019-10-05 ENCOUNTER — Other Ambulatory Visit: Payer: Self-pay | Admitting: Physician Assistant

## 2020-01-02 ENCOUNTER — Ambulatory Visit: Payer: BC Managed Care – PPO | Admitting: Physician Assistant

## 2020-02-02 ENCOUNTER — Ambulatory Visit: Payer: Self-pay | Admitting: Physician Assistant

## 2020-02-02 ENCOUNTER — Other Ambulatory Visit: Payer: Self-pay

## 2020-02-03 ENCOUNTER — Other Ambulatory Visit: Payer: Self-pay | Admitting: Physician Assistant

## 2020-02-09 ENCOUNTER — Ambulatory Visit: Payer: Self-pay | Admitting: Physician Assistant

## 2020-02-18 IMAGING — RF DG ESOPHAGUS
8 of 11 series · 13 of 24 positions shown · non-contrast
Comparison: None.

CLINICAL DATA: Gastroesophageal reflux disease. Dysphagia. Central
chest discomfort and sensation of food sticking in the central chest
for 5 months. Remote history of upper endoscopy.

EXAM:
ESOPHOGRAM / BARIUM SWALLOW / BARIUM TABLET STUDY
TECHNIQUE: Combined double contrast and single contrast examination performed
using effervescent crystals, thick barium liquid, and thin barium
liquid. The patient was observed with fluoroscopy swallowing a 13 mm
barium sulphate tablet.
FLUOROSCOPY TIME:  Fluoroscopy Time:  2 minutes 6 seconds
Radiation Exposure Index (if provided by the fluoroscopic device):
50.9 mGy
Number of Acquired Spot Images: 7

[Series 1: cp_standard · 0.34mm/px · 2 of 61 frames shown (1 of 5)]
[frame 10/61]
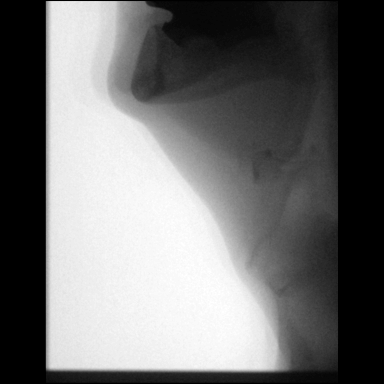
[frame 52/61]
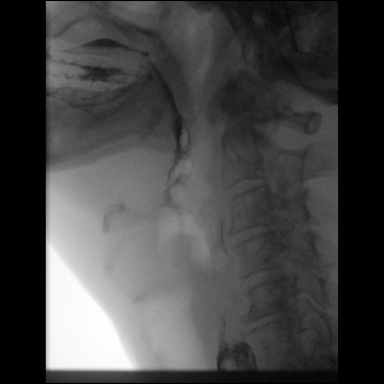

[Series 3: cp_standard · 0.34mm/px · 2 of 40 frames shown (2 of 5)]
[frame 7/40]
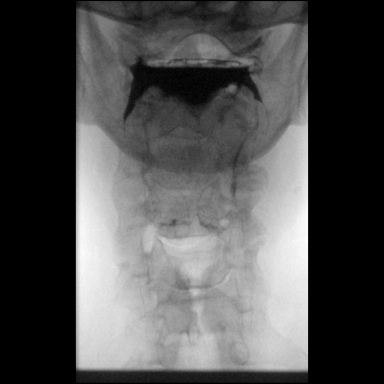
[frame 36/40]
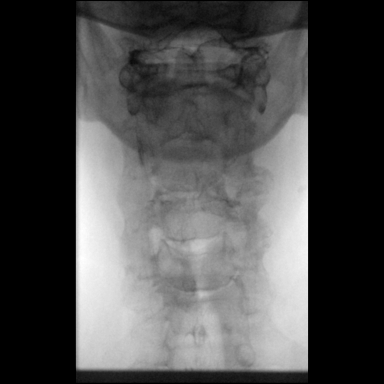

[Series 5: fluoro_barium 2fps_bw · 0.17mm/px · 1 of 1 slices shown (1 of 3)]
[im 1/1]
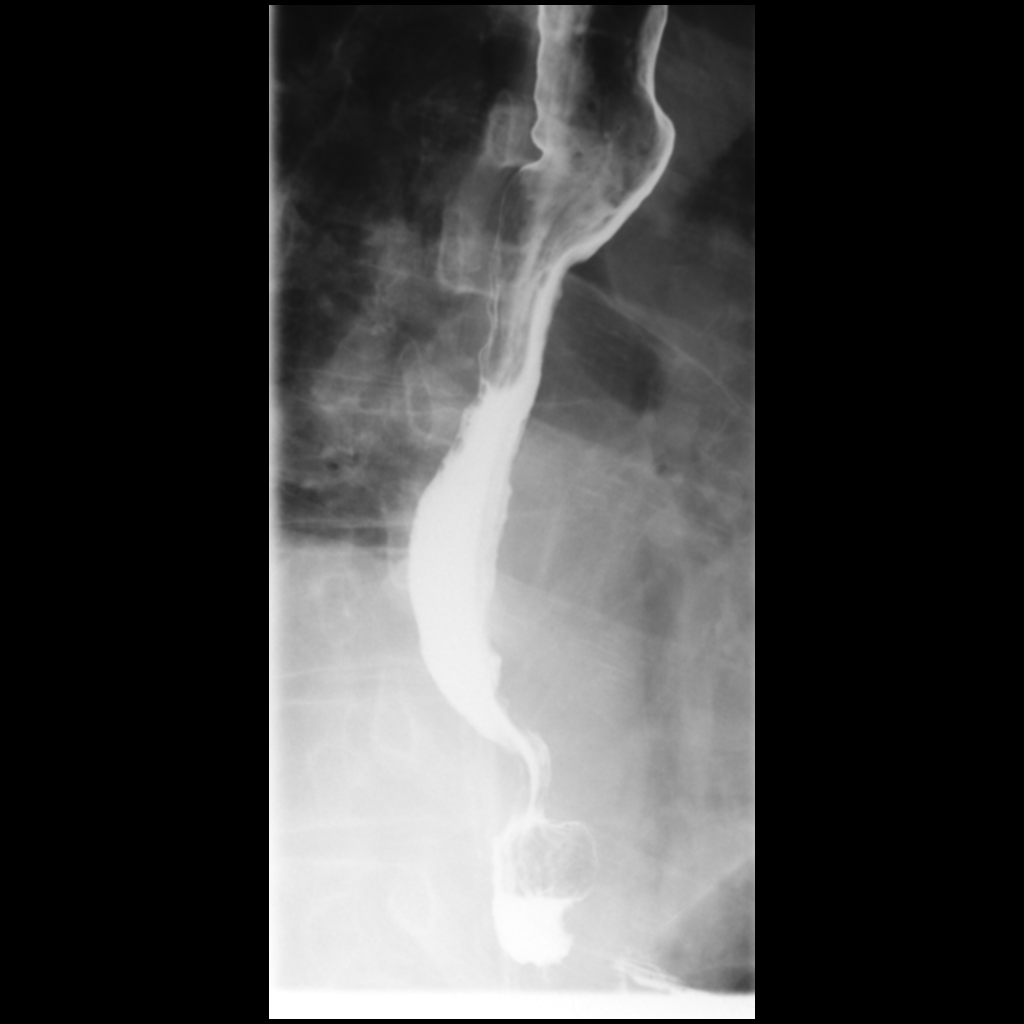

[Series 7: fluoro_barium 2fps_bw · 0.17mm/px · 1 of 2 frames shown (2 of 3)]
[frame 1/2]
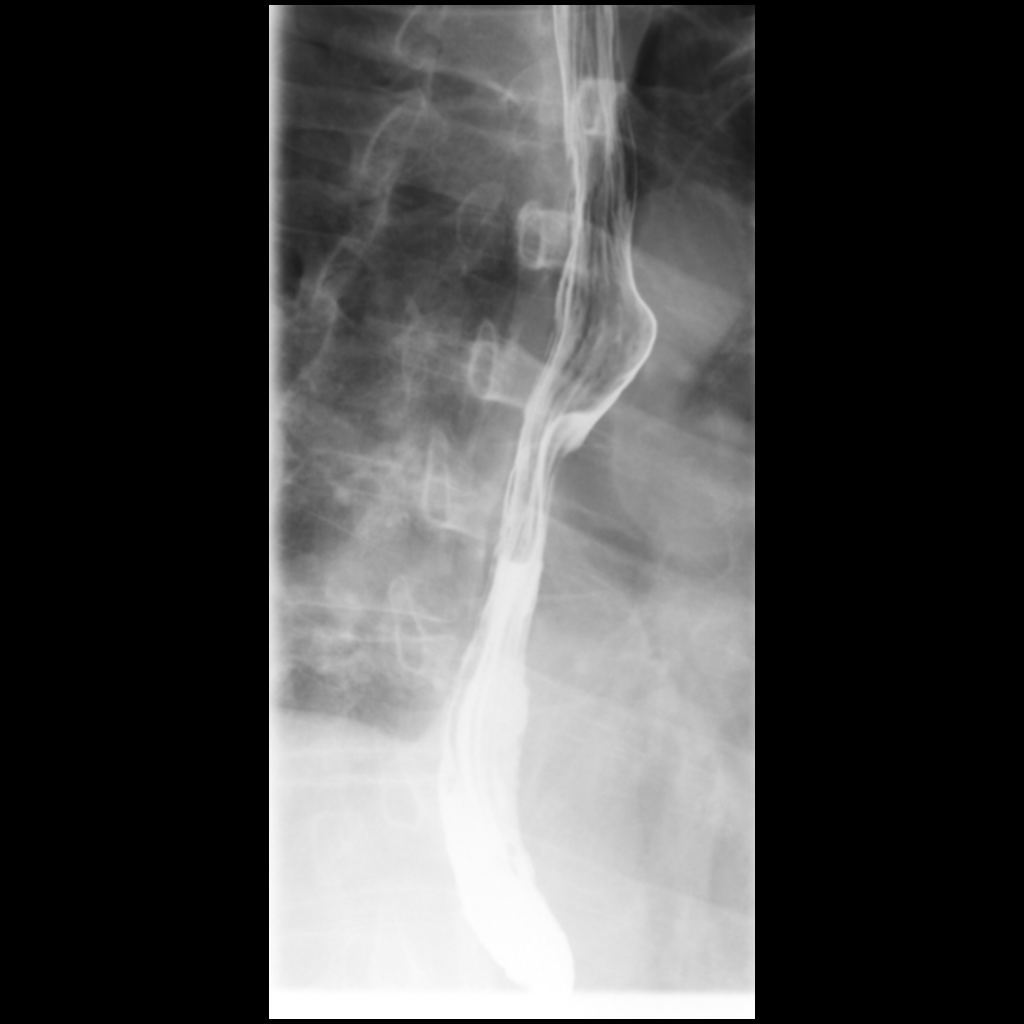

[Series 8: fluoro_barium 2fps_bw · 0.17mm/px · 1 of 1 slices shown (3 of 3)]
[im 1/1]
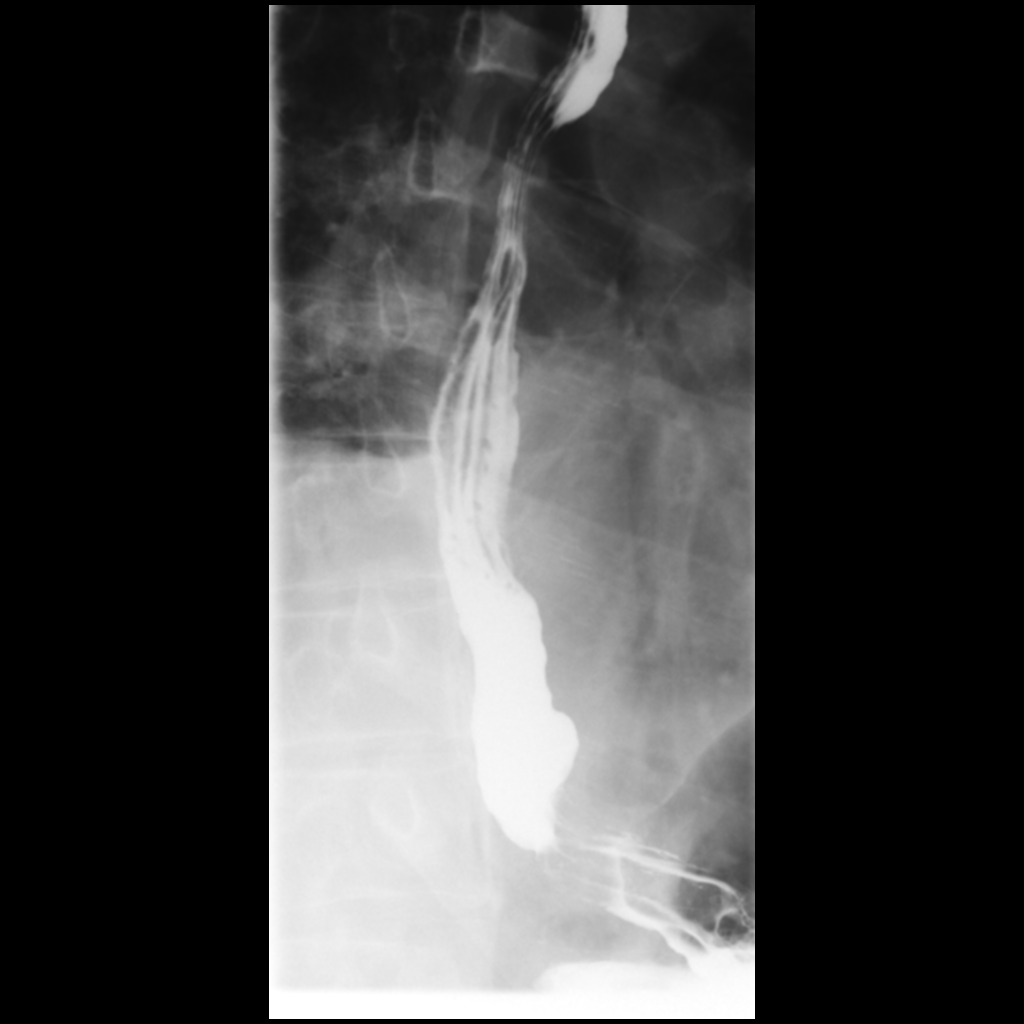

[Series 9: cp_standard · 0.51mm/px · 2 of 126 frames shown (3 of 5)]
[frame 19/126]
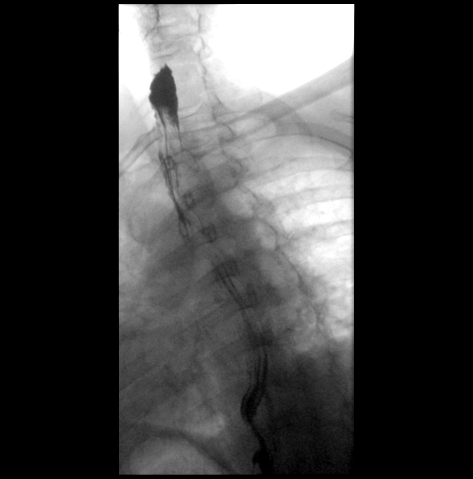
[frame 108/126]
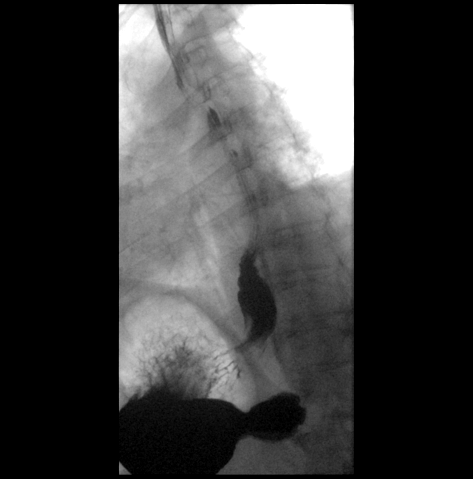

[Series 10: cp_standard · 0.55mm/px · 2 of 48 frames shown (4 of 5)]
[frame 8/48]
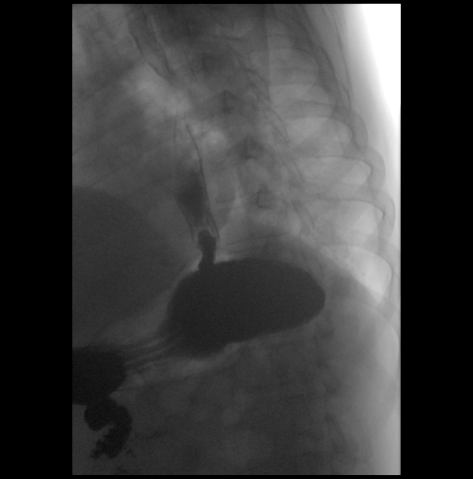
[frame 41/48]
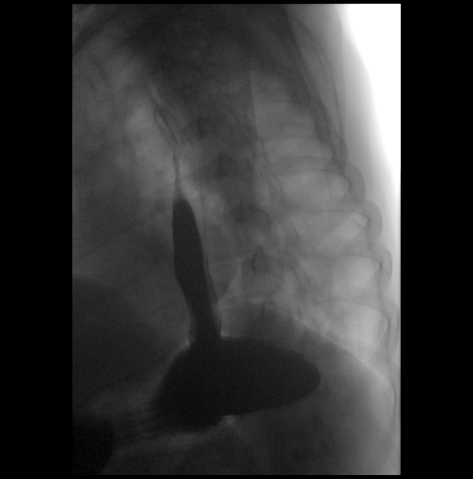

[Series 11: cp_standard · 0.55mm/px · 2 of 176 frames shown (5 of 5)]
[frame 27/176]
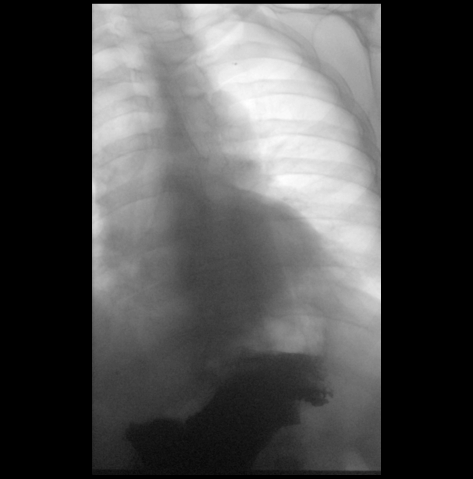
[frame 150/176]
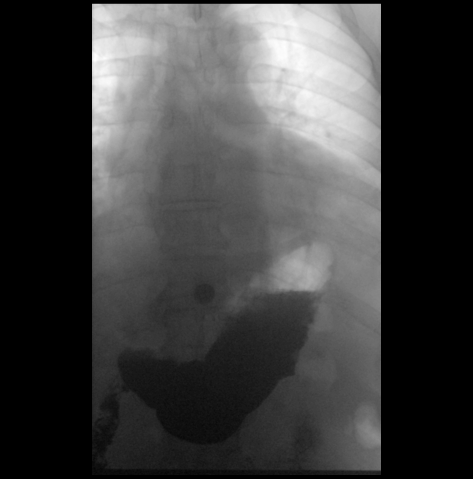

[13 of 24 positions shown; findings below may reference images not displayed]

FINDINGS: Normal oral and pharyngeal phases of swallowing, with no laryngeal
penetration or tracheobronchial aspiration. No significant barium
retention in the pharynx. No evidence of pharyngeal mass, stricture
or diverticulum. No evidence of cricopharyngeus muscle dysfunction.

Mild esophageal dysphagia characterized predominantly by proximal
escape of the barium bolus in the upper thoracic esophagus. Small
hiatal hernia. Moderate gastroesophageal reflux elicited to the
level of the midthoracic esophagus with water siphon test. Normal
esophageal distensibility, with no evidence of esophageal mass,
stricture or ulcer. Esophageal mucosa within normal limits. Barium
tablet traversed the esophagus into the stomach without delay.
IMPRESSION: 1. Small hiatal hernia.  Moderate gastroesophageal reflux elicited.
2. Mild esophageal dysphagia, with a presbyesophagus pattern.
3. No evidence of reflux esophagitis. No evidence of esophageal
mass, stricture or diverticulum.

## 2020-02-26 ENCOUNTER — Other Ambulatory Visit: Payer: Self-pay | Admitting: Physician Assistant

## 2020-03-07 ENCOUNTER — Other Ambulatory Visit: Payer: Self-pay | Admitting: Physician Assistant

## 2020-03-07 ENCOUNTER — Other Ambulatory Visit: Payer: Self-pay | Admitting: Gastroenterology

## 2020-03-07 DIAGNOSIS — R1319 Other dysphagia: Secondary | ICD-10-CM

## 2020-04-01 ENCOUNTER — Encounter: Payer: Self-pay | Admitting: Physician Assistant

## 2020-04-01 ENCOUNTER — Ambulatory Visit: Payer: BC Managed Care – PPO | Admitting: Physician Assistant

## 2020-04-01 ENCOUNTER — Other Ambulatory Visit: Payer: Self-pay

## 2020-04-01 VITALS — BP 132/72 | HR 84 | Temp 98.5°F | Ht 72.0 in | Wt 198.0 lb

## 2020-04-01 DIAGNOSIS — E038 Other specified hypothyroidism: Secondary | ICD-10-CM

## 2020-04-01 DIAGNOSIS — Z23 Encounter for immunization: Secondary | ICD-10-CM | POA: Diagnosis not present

## 2020-04-01 DIAGNOSIS — I10 Essential (primary) hypertension: Secondary | ICD-10-CM | POA: Diagnosis not present

## 2020-04-01 DIAGNOSIS — Z1211 Encounter for screening for malignant neoplasm of colon: Secondary | ICD-10-CM

## 2020-04-01 NOTE — Progress Notes (Signed)
Established Patient Office Visit  Subjective:  Patient ID: Mathew Wagner, male    DOB: Dec 06, 1955  Age: 64 y.o. MRN: [id number]  CC:  Chief Complaint  Patient presents with  . Hypertension    follow up    HPI Mathew Wagner presents for follow up hypertension and hypothyroidism  Pt presents for follow up of hypertension.  The patient is tolerating the medication well without side effects. Compliance with treatment has been good; including taking medication as directed , maintains a healthy diet and regular exercise regimen , and following up as directed. Patient was evaluated using exam, physical, labs and other information to perform evidence based decision making.he is currently on hctz 25mg  qd, amlodipine 10mg  qd and lisinopril 10mg  qd - states bp at home doing well  Hypothyroidism: Patient presents for evaluation of thyroid function. Symptoms consist of denies fatigue, weight changes, heat/cold intolerance, bowel/skin changes or CVS symptoms.  The problem has been stable.  Previous thyroid studies include TSH.  He is currently on levothyroxine and is due for labwork -   Pt would like referral to GI for screening colonoscopy  Pt due for flu shot and pneumo shot Past Medical History:  Diagnosis Date  . Allergy   . GERD (gastroesophageal reflux disease)   . Hypertension   . Thyroid disease     Past Surgical History:  Procedure Laterality Date  . BACK SURGERY     L4 and L5 possibly  . COLONOSCOPY     Dr - 2013  . ESOPHAGOGASTRODUODENOSCOPY     Dr - possibly [phone]  . NECK SURGERY     1990's.   . TONSILLECTOMY      Family History  Problem Relation Age of Onset  . Lung cancer Father   . Lung cancer Sister   . Colon cancer Neg Hx   . Esophageal cancer Neg Hx   . Rectal cancer Neg Hx   . Stomach cancer Neg Hx     Social History   Socioeconomic History  . Marital status: Married    Spouse name: Not on file  . Number of children: 2  .  Years of education: Not on file  . Highest education level: Not on file  Occupational History  . Occupation: 2014  Tobacco Use  . Smoking status: Former [employer]  . Smokeless tobacco: Never Used  . Tobacco comment: quit about 20 years ago  Vaping Use  . Vaping Use: Never used  Substance and Sexual Activity  . Alcohol use: Yes    Comment: rare  . Drug use: Not Currently  . Sexual activity: Not on file  Other Topics Concern  . Not on file  Social History Narrative  . Not on file   Social Determinants of Health   Financial Resource Strain:   . Difficulty of Paying Living Expenses: Not on file  Food Insecurity:   . Worried About 4259'D in the Last Year: Not on file  . Ran Out of Food in the Last Year: Not on file  Transportation Needs:   . Lack of Transportation (Medical): Not on file  . Lack of Transportation (Non-Medical): Not on file  Physical Activity:   . Days of Exercise per Week: Not on file  . Minutes of Exercise per Session: Not on file  Stress:   . Feeling of Stress : Not on file  Social Connections:   . Frequency of Communication with Friends and Family: Not on file  .  Frequency of Social Gatherings with Friends and Family: Not on file  . Attends Religious Services: Not on file  . Active Member of Clubs or Organizations: Not on file  . Attends Banker Meetings: Not on file  . Marital Status: Not on file  Intimate Partner Violence:   . Fear of Current or Ex-Partner: Not on file  . Emotionally Abused: Not on file  . Physically Abused: Not on file  . Sexually Abused: Not on file     Current Outpatient Medications:  .  amLODipine (NORVASC) 10 MG tablet, TAKE 1 TABLET BY MOUTH EVERY DAY INSURANCE: 3/24, Disp: 90 tablet, Rfl: 1 .  hydrochlorothiazide (HYDRODIURIL) 25 MG tablet, TAKE 1 TABLET BY MOUTH EVERY DAY FOR BLOOD PRESSURE, Disp: 90 tablet, Rfl: 0 .  levothyroxine (SYNTHROID) 175 MCG tablet, TAKE 1 TABLET BY MOUTH EVERY  DAY, Disp: 90 tablet, Rfl: 1 .  lisinopril (ZESTRIL) 10 MG tablet, TAKE 1 TABLET BY MOUTH EVERY DAY, Disp: 90 tablet, Rfl: 0   No Known Allergies  ROS CONSTITUTIONAL: Negative for chills, fatigue, fever, unintentional weight gain and unintentional weight loss.  E/N/T: Negative for ear pain, nasal congestion and sore throat.  CARDIOVASCULAR: Negative for chest pain, dizziness, palpitations and pedal edema.  RESPIRATORY: Negative for recent cough and dyspnea.  GASTROINTESTINAL: Negative for abdominal pain, acid reflux symptoms, constipation, diarrhea, nausea and vomiting.  MSK: Negative for arthralgias and myalgias.  INTEGUMENTARY: Negative for rash.  NEUROLOGICAL: Negative for dizziness and headaches.  PSYCHIATRIC: Negative for sleep disturbance and to question depression screen.  Negative for depression, negative for anhedonia.            Objective:    PHYSICAL EXAM:   VS: BP 132/72 (BP Location: Right Arm, Patient Position: Sitting, Cuff Size: Normal)   Pulse 84   Temp 98.5 F (36.9 C) (Temporal)   Ht 6' (1.829 m)   Wt 198 lb (89.8 kg)   SpO2 100%   BMI 26.85 kg/m   PHYSICAL EXAM:   VS: BP 132/72 (BP Location: Right Arm, Patient Position: Sitting, Cuff Size: Normal)   Pulse 84   Temp 98.5 F (36.9 C) (Temporal)   Ht 6' (1.829 m)   Wt 198 lb (89.8 kg)   SpO2 100%   BMI 26.85 kg/m   GEN: Well nourished, well developed, in no acute distress  Cardiac: RRR; no murmurs, rubs, or gallops,no edema - Respiratory:  normal respiratory rate and pattern with no distress - normal breath sounds with no rales, rhonchi, wheezes or rubs  Skin: warm and dry, no rash  Neuro:  Alert and Oriented x 3, Strength and sensation are intact - CN II-Xii grossly intact Psych: euthymic mood, appropriate affect and demeanor  BP 132/72 (BP Location: Right Arm, Patient Position: Sitting, Cuff Size: Normal)   Pulse 84   Temp 98.5 F (36.9 C) (Temporal)   Ht 6' (1.829 m)   Wt 198 lb (89.8  kg)   SpO2 100%   BMI 26.85 kg/m  Wt Readings from Last 3 Encounters:  04/01/20 198 lb (89.8 kg)  07/02/19 201 lb (91.2 kg)  03/24/19 200 lb (90.7 kg)     Health Maintenance Due  Topic Date Due  . COVID-19 Vaccine (1) Never done  . HIV Screening  Never done  . TETANUS/TDAP  Never done  . COLONOSCOPY  Never done  . INFLUENZA VACCINE  11/30/2019  . PNA vac Low Risk Adult (1 of 2 - PCV13) Never done  There are no preventive care reminders to display for this patient.  Lab Results  Component Value Date   TSH 0.952 07/02/2019   Lab Results  Component Value Date   WBC 6.8 07/02/2019   HGB 13.6 07/02/2019   HCT 40.5 07/02/2019   MCV 91 07/02/2019   PLT 264 07/02/2019   Lab Results  Component Value Date   NA 142 07/02/2019   K 4.1 07/02/2019   CO2 26 07/02/2019   GLUCOSE 95 07/02/2019   BUN 18 07/02/2019   CREATININE 0.99 07/02/2019   BILITOT 0.9 07/02/2019   ALKPHOS 93 07/02/2019   AST 33 07/02/2019   ALT 37 07/02/2019   PROT 6.6 07/02/2019   ALBUMIN 4.3 07/02/2019   CALCIUM 9.2 07/02/2019   Lab Results  Component Value Date   CHOL 146 07/02/2019   Lab Results  Component Value Date   HDL 34 (L) 07/02/2019   Lab Results  Component Value Date   LDLCALC 92 07/02/2019   Lab Results  Component Value Date   TRIG 110 07/02/2019   Lab Results  Component Value Date   CHOLHDL 4.3 07/02/2019   No results found for: HGBA1C    Assessment & Plan:   Problem List Items Addressed This Visit      Cardiovascular and Mediastinum   Essential hypertension, benign   Relevant Orders   CBC with Differential/Platelet   Comprehensive metabolic panel   Lipid panel Continue current meds     Endocrine   Other specified hypothyroidism   Relevant Orders   TSH Continue current meds     Other   Need for prophylactic vaccination and inoculation against influenza - Primary   Relevant Orders   Flu Vaccine QUAD High Dose(Fluad)    Other Visit Diagnoses    Need  for prophylactic vaccination against Streptococcus pneumoniae (pneumococcus)       Relevant Orders   Pneumococcal conjugate vaccine 13-valent   Screen for colon cancer       Relevant Orders   Ambulatory referral to Gastroenterology      No orders of the defined types were placed in this encounter.   Follow-up: No follow-ups on file.    SARA R Karsten Howry, PA-C

## 2020-04-02 LAB — COMPREHENSIVE METABOLIC PANEL
ALT: 39 IU/L (ref 0–44)
AST: 26 IU/L (ref 0–40)
Albumin/Globulin Ratio: 1.8 (ref 1.2–2.2)
Albumin: 4.6 g/dL (ref 3.8–4.8)
Alkaline Phosphatase: 99 IU/L (ref 44–121)
BUN/Creatinine Ratio: 18 (ref 10–24)
BUN: 20 mg/dL (ref 8–27)
Bilirubin Total: 0.7 mg/dL (ref 0.0–1.2)
CO2: 26 mmol/L (ref 20–29)
Calcium: 9.6 mg/dL (ref 8.6–10.2)
Chloride: 101 mmol/L (ref 96–106)
Creatinine, Ser: 1.09 mg/dL (ref 0.76–1.27)
GFR calc Af Amer: 82 mL/min/{1.73_m2} (ref >59)
GFR calc non Af Amer: 71 mL/min/{1.73_m2} (ref >59)
Globulin, Total: 2.5 g/dL (ref 1.5–4.5)
Glucose: 102 mg/dL — ABNORMAL HIGH (ref 65–99)
Potassium: 4.5 mmol/L (ref 3.5–5.2)
Sodium: 142 mmol/L (ref 134–144)
Total Protein: 7.1 g/dL (ref 6.0–8.5)

## 2020-04-02 LAB — CBC WITH DIFFERENTIAL/PLATELET
Basophils Absolute: 0.1 10*3/uL (ref 0.0–0.2)
Basos: 1 %
EOS (ABSOLUTE): 0.4 10*3/uL (ref 0.0–0.4)
Eos: 5 %
Hematocrit: 45.4 % (ref 37.5–51.0)
Hemoglobin: 15.1 g/dL (ref 13.0–17.7)
Immature Grans (Abs): 0 10*3/uL (ref 0.0–0.1)
Immature Granulocytes: 0 %
Lymphocytes Absolute: 2.2 10*3/uL (ref 0.7–3.1)
Lymphs: 26 %
MCH: 31.2 pg (ref 26.6–33.0)
MCHC: 33.3 g/dL (ref 31.5–35.7)
MCV: 94 fL (ref 79–97)
Monocytes Absolute: 0.5 10*3/uL (ref 0.1–0.9)
Monocytes: 6 %
Neutrophils Absolute: 5.1 10*3/uL (ref 1.4–7.0)
Neutrophils: 62 %
Platelets: 278 10*3/uL (ref 150–450)
RBC: 4.84 x10E6/uL (ref 4.14–5.80)
RDW: 13 % (ref 11.6–15.4)
WBC: 8.3 10*3/uL (ref 3.4–10.8)

## 2020-04-02 LAB — LIPID PANEL
Chol/HDL Ratio: 5.2 ratio — ABNORMAL HIGH (ref 0.0–5.0)
Cholesterol, Total: 197 mg/dL (ref 100–199)
HDL: 38 mg/dL — ABNORMAL LOW (ref >39)
LDL Chol Calc (NIH): 139 mg/dL — ABNORMAL HIGH (ref 0–99)
Triglycerides: 107 mg/dL (ref 0–149)
VLDL Cholesterol Cal: 20 mg/dL (ref 5–40)

## 2020-04-02 LAB — TSH: TSH: 1.07 u[IU]/mL (ref 0.450–4.500)

## 2020-04-02 LAB — CARDIOVASCULAR RISK ASSESSMENT

## 2020-04-06 NOTE — Progress Notes (Signed)
Needs screening colonoscopy.  I believe before the end of the year  Trish Looks like you have some openings on December 10 ? See if he wants to get it done  RG

## 2020-04-11 ENCOUNTER — Encounter: Payer: Self-pay | Admitting: Physician Assistant

## 2020-05-29 ENCOUNTER — Other Ambulatory Visit: Payer: Self-pay | Admitting: Family Medicine

## 2020-06-11 ENCOUNTER — Other Ambulatory Visit: Payer: Self-pay | Admitting: Physician Assistant

## 2020-06-12 ENCOUNTER — Other Ambulatory Visit: Payer: Self-pay | Admitting: Gastroenterology

## 2020-06-12 DIAGNOSIS — R1319 Other dysphagia: Secondary | ICD-10-CM

## 2020-09-15 ENCOUNTER — Other Ambulatory Visit: Payer: Self-pay | Admitting: Physician Assistant

## 2020-09-24 ENCOUNTER — Other Ambulatory Visit: Payer: Self-pay | Admitting: Family Medicine

## 2020-09-30 ENCOUNTER — Ambulatory Visit: Payer: BC Managed Care – PPO | Admitting: Physician Assistant

## 2021-01-09 ENCOUNTER — Other Ambulatory Visit: Payer: Self-pay | Admitting: Family Medicine

## 2021-01-18 ENCOUNTER — Ambulatory Visit: Payer: BC Managed Care – PPO | Admitting: Physician Assistant

## 2021-04-14 ENCOUNTER — Other Ambulatory Visit: Payer: Self-pay | Admitting: Family Medicine

## 2021-05-05 ENCOUNTER — Other Ambulatory Visit: Payer: Self-pay | Admitting: Family Medicine

## 2021-05-05 NOTE — Telephone Encounter (Signed)
Called pt. He is unable to make appointment at this time. He is currently filling out social security paperwork. Estimates 30 da wait time. He is not in need of medication. Did advise he make appointment asap due to one year since last office visit. He VU.   Royce Macadamia, [place] 05/05/21 1:43 PM

## 2021-08-25 ENCOUNTER — Encounter: Payer: Self-pay | Admitting: Physician Assistant

## 2021-08-25 ENCOUNTER — Ambulatory Visit (INDEPENDENT_AMBULATORY_CARE_PROVIDER_SITE_OTHER): Payer: Medicare Other | Admitting: Physician Assistant

## 2021-08-25 VITALS — BP 124/84 | HR 78 | Temp 98.3°F | Ht 72.0 in | Wt 211.6 lb

## 2021-08-25 DIAGNOSIS — E782 Mixed hyperlipidemia: Secondary | ICD-10-CM

## 2021-08-25 DIAGNOSIS — E038 Other specified hypothyroidism: Secondary | ICD-10-CM | POA: Diagnosis not present

## 2021-08-25 DIAGNOSIS — K219 Gastro-esophageal reflux disease without esophagitis: Secondary | ICD-10-CM

## 2021-08-25 DIAGNOSIS — I10 Essential (primary) hypertension: Secondary | ICD-10-CM

## 2021-08-25 DIAGNOSIS — Z125 Encounter for screening for malignant neoplasm of prostate: Secondary | ICD-10-CM

## 2021-08-25 DIAGNOSIS — Z23 Encounter for immunization: Secondary | ICD-10-CM

## 2021-08-25 DIAGNOSIS — Z1211 Encounter for screening for malignant neoplasm of colon: Secondary | ICD-10-CM

## 2021-08-25 DIAGNOSIS — R350 Frequency of micturition: Secondary | ICD-10-CM

## 2021-08-25 MED ORDER — LISINOPRIL 10 MG PO TABS: 10.0000 mg | ORAL_TABLET | Freq: Every day | ORAL | 1 refills | Status: DC

## 2021-08-25 MED ORDER — OMEPRAZOLE 40 MG PO CPDR: 40.0000 mg | DELAYED_RELEASE_CAPSULE | Freq: Every day | ORAL | 1 refills | Status: DC

## 2021-08-25 MED ORDER — HYDROCHLOROTHIAZIDE 25 MG PO TABS: ORAL_TABLET | ORAL | 1 refills | Status: DC

## 2021-08-25 MED ORDER — LEVOTHYROXINE SODIUM 175 MCG PO TABS: 175.0000 ug | ORAL_TABLET | Freq: Every day | ORAL | 1 refills | Status: DC

## 2021-08-25 MED ORDER — AMLODIPINE BESYLATE 10 MG PO TABS: ORAL_TABLET | ORAL | 1 refills | Status: DC

## 2021-08-25 NOTE — Progress Notes (Signed)
? ?Subjective:  ?Patient ID: Mathew Wagner, male    DOB: 03/24/1956  Age: 66 y.o. MRN: [id number] ? ?Chief Complaint  ?Patient presents with  ? Hypertension  ? ? ?HPI ? Pt presents for follow up of hypertension. Patient had not had insurance for awhile and is overdue for follow up and labwork - he is currently taking amlodpiine 10mg , hctz 25mg  and lisinopril 10mg  - requests refill of medications ?Denies chest pain, dyspnea or edema ? ?Pt with history of hypothyrodism - currently on synthroid qd - voices no problems or concerns ? ?Pt is overdue for colonoscopy - is due for appt with Dr and ready to schedule ? ?Pt has history of GERD/hiatal hernia - would like to try omeprazole again to see if helpful with symptoms ? ?Pt would like to get Pneumo 23 today ?No current outpatient medications on file prior to visit.  ? ?No current facility-administered medications on file prior to visit.  ? ?Past Medical History:  ?Diagnosis Date  ? Allergy   ? GERD (gastroesophageal reflux disease)   ? Hypertension   ? Thyroid disease   ? ?Past Surgical History:  ?Procedure Laterality Date  ? BACK SURGERY    ? L4 and L5 possibly  ? COLONOSCOPY    ? Dr - 2013  ? ESOPHAGOGASTRODUODENOSCOPY    ? Dr Chales Abrahams- possibly [phone]  ? NECK SURGERY    ? 1990's.   ? TONSILLECTOMY    ?  ?Family History  ?Problem Relation Age of Onset  ? Lung cancer Father   ? Lung cancer Sister   ? Colon cancer Neg Hx   ? Esophageal cancer Neg Hx   ? Rectal cancer Neg Hx   ? Stomach cancer Neg Hx   ? ?Social History  ? ?Socioeconomic History  ? Marital status: Married  ?  Spouse name: Not on file  ? Number of children: 2  ? Years of education: Not on file  ? Highest education level: Not on file  ?Occupational History  ? Occupation: 2014  ?Tobacco Use  ? Smoking status: Former  ? Smokeless tobacco: Never  ? Tobacco comments:  ?  quit about 20 years ago  ?Vaping Use  ? Vaping Use: Never used  ?Substance and Sexual Activity  ? Alcohol use:  Yes  ?  Comment: rare  ? Drug use: Not Currently  ? Sexual activity: Not on file  ?Other Topics Concern  ? Not on file  ?Social History Narrative  ? Not on file  ? ?Social Determinants of Health  ? ?Financial Resource Strain: Not on file  ?Food Insecurity: Not on file  ?Transportation Needs: Not on file  ?Physical Activity: Not on file  ?Stress: Not on file  ?Social Connections: Not on file  ? ? ?Review of Systems ?CONSTITUTIONAL: Negative for chills, fatigue, fever, unintentional weight gain and unintentional weight loss.  ?E/N/T: Negative for ear pain, nasal congestion and sore throat.  ?CARDIOVASCULAR: Negative for chest pain, dizziness, palpitations and pedal edema.  ?RESPIRATORY: Negative for recent cough and dyspnea.  ?GASTROINTESTINAL: see HPI ?MSK: Negative for arthralgias and myalgias.  ?INTEGUMENTARY: Negative for rash.  ?NEUROLOGICAL: Negative for dizziness and headaches.  ?PSYCHIATRIC: Negative for sleep disturbance and to question depression screen.  Negative for depression, negative for anhedonia.  ?   ? ? ?Objective:  ?BP 124/84   Pulse 78   Temp 98.3 ?F (36.8 ?C)   Ht 6' (1.829 m)   Wt 211 lb 9.6 oz (96  kg)   SpO2 96%   BMI 28.70 kg/m?  ? ? ?  08/25/2021  ?  9:09 AM 04/01/2020  ?  8:55 AM 07/02/2019  ?  8:45 AM  ?BP/Weight  ?Systolic BP 124 132 136  ?Diastolic BP 84 72 74  ?Wt. (Lbs) 211.6 198 201  ?BMI 28.7 kg/m2 26.85 kg/m2 27.26 kg/m2  ? ? ?Physical Exam ?PHYSICAL EXAM:  ? ?VS: BP 124/84   Pulse 78   Temp 98.3 ?F (36.8 ?C)   Ht 6' (1.829 m)   Wt 211 lb 9.6 oz (96 kg)   SpO2 96%   BMI 28.70 kg/m?  ? ?GEN: Well nourished, well developed, in no acute distress  ?Cardiac: RRR; no murmurs, rubs, or gallops,no edema -  ?Respiratory:  normal respiratory rate and pattern with no distress - normal breath sounds with no rales, rhonchi, wheezes or rubs ?GI: normal bowel sounds, no masses or tenderness ?MS: no deformity or atrophy  ?Skin: warm and dry, no rash  ?Psych: euthymic mood, appropriate affect  and demeanor ? ?Diabetic Foot Exam - Simple   ?No data filed ?  ?  ? ?Lab Results  ?Component Value Date  ? WBC 8.3 04/01/2020  ? HGB 15.1 04/01/2020  ? HCT 45.4 04/01/2020  ? PLT 278 04/01/2020  ? GLUCOSE 102 (H) 04/01/2020  ? CHOL 197 04/01/2020  ? TRIG 107 04/01/2020  ? HDL 38 (L) 04/01/2020  ? LDLCALC 139 (H) 04/01/2020  ? ALT 39 04/01/2020  ? AST 26 04/01/2020  ? NA 142 04/01/2020  ? K 4.5 04/01/2020  ? CL 101 04/01/2020  ? CREATININE 1.09 04/01/2020  ? BUN 20 04/01/2020  ? CO2 26 04/01/2020  ? TSH 1.070 04/01/2020  ? ? ? ? ?Assessment & Plan:  ? ?Problem List Items Addressed This Visit   ? ?  ? Cardiovascular and Mediastinum  ? Essential hypertension, benign - Primary  ? Relevant Medications  ? amLODipine (NORVASC) 10 MG tablet  ? hydrochlorothiazide (HYDRODIURIL) 25 MG tablet  ? lisinopril (ZESTRIL) 10 MG tablet  ? Other Relevant Orders  ? CBC with Differential/Platelet  ? Comprehensive metabolic panel  ? Lipid panel  ?  ? Endocrine  ? Other specified hypothyroidism  ? Relevant Medications  ? levothyroxine (SYNTHROID) 175 MCG tablet  ? Other Relevant Orders  ? TSH  ? ?Other Visit Diagnoses   ? ? Prostate cancer screening      ? Relevant Orders  ? PSA  ? Mixed hyperlipidemia      ? Relevant Medications  ? amLODipine (NORVASC) 10 MG tablet  ? hydrochlorothiazide (HYDRODIURIL) 25 MG tablet  ? lisinopril (ZESTRIL) 10 MG tablet  ? Other Relevant Orders  ? Lipid panel  ? Frequency of micturition      ? Relevant Orders  ? PSA  ? Need for prophylactic vaccination against Streptococcus pneumoniae (pneumococcus)      ? Relevant Orders  ? Pneumococcal polysaccharide vaccine 23-valent greater than or equal to 2yo subcutaneous/IM  ? Gastroesophageal reflux disease without esophagitis      ? Relevant Medications  ? omeprazole (PRILOSEC) 40 MG capsule  ? Screen for colon cancer      ? Relevant Orders  ? Ambulatory referral to Gastroenterology  ? ?  ?. ? ?Meds ordered this encounter  ?Medications  ? amLODipine (NORVASC) 10  MG tablet  ?  Sig: TAKE 1 TABLET BY MOUTH EVERY DAY INSURANCE: 3/24  ?  Dispense:  90 tablet  ?  Refill:  1  ?  Order Specific Question:   Supervising Provider  ?  AnswerBlane Ohara [[id number]]  ? hydrochlorothiazide (HYDRODIURIL) 25 MG tablet  ?  Sig: TAKE 1 TABLET BY MOUTH EVERY DAY FOR BLOOD PRESSURE  ?  Dispense:  90 tablet  ?  Refill:  1  ?  Order Specific Question:   Supervising Provider  ?  AnswerBlane Ohara [[id number]]  ? levothyroxine (SYNTHROID) 175 MCG tablet  ?  Sig: Take 1 tablet (175 mcg total) by mouth daily.  ?  Dispense:  90 tablet  ?  Refill:  1  ?  Order Specific Question:   Supervising Provider  ?  AnswerBlane Ohara [[id number]]  ? lisinopril (ZESTRIL) 10 MG tablet  ?  Sig: Take 1 tablet (10 mg total) by mouth daily.  ?  Dispense:  90 tablet  ?  Refill:  1  ?  Order Specific Question:   Supervising Provider  ?  AnswerBlane Ohara [[id number]]  ? omeprazole (PRILOSEC) 40 MG capsule  ?  Sig: Take 1 capsule (40 mg total) by mouth daily.  ?  Dispense:  90 capsule  ?  Refill:  1  ?  Order Specific Question:   Supervising Provider  ?  AnswerBlane Ohara [[id number]]  ? ? ?Orders Placed This Encounter  ?Procedures  ? Pneumococcal polysaccharide vaccine 23-valent greater than or equal to 2yo subcutaneous/IM  ? CBC with Differential/Platelet  ? Comprehensive metabolic panel  ? TSH  ? Lipid panel  ? PSA  ? Ambulatory referral to Gastroenterology  ?  ? ?Follow-up: No follow-ups on file. ? ?An After Visit Summary was printed and given to the patient. ? ?SARA R Gidget Quizhpi, PA-C ?[facility] ?[phone]

## 2021-08-26 LAB — LIPID PANEL
Chol/HDL Ratio: 5.5 ratio — ABNORMAL HIGH (ref 0.0–5.0)
Cholesterol, Total: 165 mg/dL (ref 100–199)
HDL: 30 mg/dL — ABNORMAL LOW (ref >39)
LDL Chol Calc (NIH): 118 mg/dL — ABNORMAL HIGH (ref 0–99)
Triglycerides: 93 mg/dL (ref 0–149)
VLDL Cholesterol Cal: 17 mg/dL (ref 5–40)

## 2021-08-26 LAB — CBC WITH DIFFERENTIAL/PLATELET
Basophils Absolute: 0.1 10*3/uL (ref 0.0–0.2)
Basos: 1 %
EOS (ABSOLUTE): 0.3 10*3/uL (ref 0.0–0.4)
Eos: 4 %
Hematocrit: 42.2 % (ref 37.5–51.0)
Hemoglobin: 14.1 g/dL (ref 13.0–17.7)
Immature Grans (Abs): 0 10*3/uL (ref 0.0–0.1)
Immature Granulocytes: 0 %
Lymphocytes Absolute: 1.8 10*3/uL (ref 0.7–3.1)
Lymphs: 25 %
MCH: 30.6 pg (ref 26.6–33.0)
MCHC: 33.4 g/dL (ref 31.5–35.7)
MCV: 92 fL (ref 79–97)
Monocytes Absolute: 0.4 10*3/uL (ref 0.1–0.9)
Monocytes: 6 %
Neutrophils Absolute: 4.5 10*3/uL (ref 1.4–7.0)
Neutrophils: 64 %
Platelets: 270 10*3/uL (ref 150–450)
RBC: 4.61 x10E6/uL (ref 4.14–5.80)
RDW: 13.1 % (ref 11.6–15.4)
WBC: 7 10*3/uL (ref 3.4–10.8)

## 2021-08-26 LAB — COMPREHENSIVE METABOLIC PANEL
ALT: 35 IU/L (ref 0–44)
AST: 25 IU/L (ref 0–40)
Albumin/Globulin Ratio: 1.7 (ref 1.2–2.2)
Albumin: 4.1 g/dL (ref 3.8–4.8)
Alkaline Phosphatase: 91 IU/L (ref 44–121)
BUN/Creatinine Ratio: 16 (ref 10–24)
BUN: 16 mg/dL (ref 8–27)
Bilirubin Total: 0.5 mg/dL (ref 0.0–1.2)
CO2: 29 mmol/L (ref 20–29)
Calcium: 9.5 mg/dL (ref 8.6–10.2)
Chloride: 103 mmol/L (ref 96–106)
Creatinine, Ser: 1 mg/dL (ref 0.76–1.27)
Globulin, Total: 2.4 g/dL (ref 1.5–4.5)
Glucose: 104 mg/dL — ABNORMAL HIGH (ref 70–99)
Potassium: 4.2 mmol/L (ref 3.5–5.2)
Sodium: 144 mmol/L (ref 134–144)
Total Protein: 6.5 g/dL (ref 6.0–8.5)
eGFR: 83 mL/min/{1.73_m2} (ref >59)

## 2021-08-26 LAB — CARDIOVASCULAR RISK ASSESSMENT

## 2021-08-26 LAB — PSA: Prostate Specific Ag, Serum: 1.1 ng/mL (ref 0.0–4.0)

## 2021-08-26 LAB — TSH: TSH: 1.75 u[IU]/mL (ref 0.450–4.500)

## 2021-10-05 ENCOUNTER — Telehealth: Payer: Self-pay

## 2021-10-05 NOTE — Telephone Encounter (Signed)
I left a message requesting the patient to call the office back. We need a updated copy of his ins card for his phcs

## 2021-10-05 NOTE — Telephone Encounter (Signed)
Patient called back. He stated he does not have phcs ins anymore.

## 2022-02-28 ENCOUNTER — Ambulatory Visit (INDEPENDENT_AMBULATORY_CARE_PROVIDER_SITE_OTHER): Payer: Medicare Other | Admitting: Physician Assistant

## 2022-02-28 ENCOUNTER — Encounter: Payer: Self-pay | Admitting: Physician Assistant

## 2022-02-28 VITALS — BP 132/80 | HR 77 | Temp 97.2°F | Ht 72.0 in | Wt 215.0 lb

## 2022-02-28 DIAGNOSIS — M8580 Other specified disorders of bone density and structure, unspecified site: Secondary | ICD-10-CM

## 2022-02-28 DIAGNOSIS — E038 Other specified hypothyroidism: Secondary | ICD-10-CM | POA: Diagnosis not present

## 2022-02-28 DIAGNOSIS — Z23 Encounter for immunization: Secondary | ICD-10-CM | POA: Diagnosis not present

## 2022-02-28 DIAGNOSIS — K219 Gastro-esophageal reflux disease without esophagitis: Secondary | ICD-10-CM | POA: Diagnosis not present

## 2022-02-28 DIAGNOSIS — Z1211 Encounter for screening for malignant neoplasm of colon: Secondary | ICD-10-CM | POA: Insufficient documentation

## 2022-02-28 DIAGNOSIS — I10 Essential (primary) hypertension: Secondary | ICD-10-CM

## 2022-02-28 DIAGNOSIS — R0789 Other chest pain: Secondary | ICD-10-CM | POA: Insufficient documentation

## 2022-02-28 MED ORDER — PANTOPRAZOLE SODIUM 40 MG PO TBEC: 40.0000 mg | DELAYED_RELEASE_TABLET | Freq: Every day | ORAL | 3 refills | Status: DC

## 2022-02-28 NOTE — Progress Notes (Signed)
Subjective:  Patient ID: Mathew Wagner, male    DOB: [birth year]  Age: 66 y.o. MRN: [id number]  Chief Complaint  Patient presents with   Hypertension    HPI  Pt presents for follow up of hypertension. The patient is tolerating the medication well without side effects. Compliance with treatment has been good; including taking medication as directed , maintains a healthy diet and regular exercise regimen , and following up as directed. Pt is currently on norvasc 10mg  qd, hctz 25mg  qd, zestril 10mg  qd Pt states that he has had some intermittent chest tightness at work - last episode a few months ago.  Says can last a 'little while' to an hour or so.  Pt does have history of being smoker  Pt with history of GERD   He stopped omeprazole stating he did not feel that it was working.  He has had some trouble swallowing while eating and has had a history of esophageal strictures resulting in esophageal dilation in past.  Feels like that is starting to happened again. Of note these symptoms are different from the chest discomfort as described abouve Pt is due for screening colonoscopy also  Pt with history of thyroid disease - currently on synthroid qd - due for labwork  It is noted that pt was diagnosed with osteopenia in 2018 by dexa scan.  He is a prior smoker but no other risk factors.  At this time pt would like to defer dexa scan  Pt would like flu vaccine today -- he thought [facility] gave him the vaccine in August however they gave him Pneumo 20 (which was too early considering our office gave him Pneumo 23 08/25/21)   Current Outpatient Medications on File Prior to Visit  Medication Sig Dispense Refill   amLODipine (NORVASC) 10 MG tablet TAKE 1 TABLET BY MOUTH EVERY DAY INSURANCE: 3/24 90 tablet 1   hydrochlorothiazide (HYDRODIURIL) 25 MG tablet TAKE 1 TABLET BY MOUTH EVERY DAY FOR BLOOD PRESSURE 90 tablet 1   levothyroxine (SYNTHROID) 175 MCG tablet Take 1 tablet (175 mcg total) by  mouth daily. 90 tablet 1   lisinopril (ZESTRIL) 10 MG tablet Take 1 tablet (10 mg total) by mouth daily. 90 tablet 1   No current facility-administered medications on file prior to visit.   Past Medical History:  Diagnosis Date   Allergy    GERD (gastroesophageal reflux disease)    Hypertension    Thyroid disease    Past Surgical History:  Procedure Laterality Date   BACK SURGERY     L4 and L5 possibly   COLONOSCOPY     Dr 21- 2013   ESOPHAGOGASTRODUODENOSCOPY     Dr 4/24- possibly 1990's   NECK SURGERY     1990's.    TONSILLECTOMY      Family History  Problem Relation Age of Onset   Lung cancer Father    Lung cancer Sister    Colon cancer Neg Hx    Esophageal cancer Neg Hx    Rectal cancer Neg Hx    Stomach cancer Neg Hx    Social History   Socioeconomic History   Marital status: Married    Spouse name: Not on file   Number of children: 2   Years of education: Not on file   Highest education level: Not on file  Occupational History   Occupation: elastic [employer]  Tobacco Use   Smoking status: Former   Smokeless tobacco: Never   Tobacco comments:  quit about 20 years ago  Vaping Use   Vaping Use: Never used  Substance and Sexual Activity   Alcohol use: Yes    Comment: rare   Drug use: Not Currently   Sexual activity: Not on file  Other Topics Concern   Not on file  Social History Narrative   Not on file   Social Determinants of Health   Financial Resource Strain: Not on file  Food Insecurity: Not on file  Transportation Needs: Not on file  Physical Activity: Not on file  Stress: Not on file  Social Connections: Not on file    Review of Systems CONSTITUTIONAL: Negative for chills, fatigue, fever, unintentional weight gain and unintentional weight loss.  E/N/T: Negative for ear pain, nasal congestion and sore throat.  CARDIOVASCULAR:see HPI  RESPIRATORY: Negative for recent cough and dyspnea.  GASTROINTESTINALsee HPI  MSK: Negative for  arthralgias and myalgias.  INTEGUMENTARY: Negative for rash.  NEUROLOGICAL: Negative for dizziness and headaches.  PSYCHIATRIC: Negative for sleep disturbance and to question depression screen.  Negative for depression, negative for anhedonia.       Objective:  PHYSICAL EXAM:   VS: BP 132/80 (BP Location: Left Arm, Patient Position: Sitting, Cuff Size: Large)   Pulse 77   Temp (!) 97.2 F (36.2 C) (Temporal)   Ht 6' (1.829 m)   Wt 215 lb (97.5 kg)   SpO2 96%   BMI 29.16 kg/m   GEN: Well nourished, well developed, in no acute distress  Cardiac: RRR; no murmurs, rubs, or gallops,no edema -  Respiratory:  normal respiratory rate and pattern with no distress - normal breath sounds with no rales, rhonchi, wheezes or rubs GI: normal bowel sounds, no masses or tenderness MS: no deformity or atrophy  Skin: warm and dry, no rash  Psych: euthymic mood, appropriate affect and demeanor  EKG normal Lab Results  Component Value Date   WBC 7.0 08/25/2021   HGB 14.1 08/25/2021   HCT 42.2 08/25/2021   PLT 270 08/25/2021   GLUCOSE 104 (H) 08/25/2021   CHOL 165 08/25/2021   TRIG 93 08/25/2021   HDL 30 (L) 08/25/2021   LDLCALC 118 (H) 08/25/2021   ALT 35 08/25/2021   AST 25 08/25/2021   NA 144 08/25/2021   K 4.2 08/25/2021   CL 103 08/25/2021   CREATININE 1.00 08/25/2021   BUN 16 08/25/2021   CO2 29 08/25/2021   TSH 1.750 08/25/2021      Assessment & Plan:   Problem List Items Addressed This Visit       Cardiovascular and Mediastinum   Essential hypertension, benign - Primary   Relevant Orders   CBC with Differential/Platelet   Comprehensive metabolic panel   Lipid panel   EKG 12-Lead Continue current meds     Digestive   GERD without esophagitis   Relevant Medications   pantoprazole (PROTONIX) 40 MG tablet Stop omeprazole   Other Relevant Orders   Ambulatory referral to Gastroenterology     Endocrine   Other specified hypothyroidism   Relevant Orders    TSH Continue synthroid     Musculoskeletal and Integument   Osteopenia   Relevant Orders   VITAMIN D 25 Hydroxy (Vit-D Deficiency, Fractures) Pt defers dexa Recommend multivitamin     Other   Needs flu shot   Relevant Orders   Flu Vaccine QUAD High Dose(Fluad) (Completed)   Atypical chest pain   Relevant Orders   Ambulatory referral to Cardiology   Colon cancer screening  Relevant Orders   Ambulatory referral to Gastroenterology  .  Meds ordered this encounter  Medications   pantoprazole (PROTONIX) 40 MG tablet    Sig: Take 1 tablet (40 mg total) by mouth daily.    Dispense:  30 tablet    Refill:  3    Order Specific Question:   Supervising Provider    AnswerCorey Harold    Orders Placed This Encounter  Procedures   Flu Vaccine QUAD High Dose(Fluad)   CBC with Differential/Platelet   Comprehensive metabolic panel   TSH   Lipid panel   VITAMIN D 25 Hydroxy (Vit-D Deficiency, Fractures)   Ambulatory referral to Gastroenterology   Ambulatory referral to Cardiology   EKG 12-Lead     Follow-up: Return in about 6 months (around 08/29/2022) for chronic fasting follow up with me ---- also schedule MCR wellness with Selena Batten before end of year.  An After Visit Summary was printed and given to the patient.  Jettie Pagan [facility] [phone]

## 2022-03-01 ENCOUNTER — Other Ambulatory Visit: Payer: Self-pay | Admitting: Physician Assistant

## 2022-03-01 DIAGNOSIS — I10 Essential (primary) hypertension: Secondary | ICD-10-CM

## 2022-03-01 DIAGNOSIS — E559 Vitamin D deficiency, unspecified: Secondary | ICD-10-CM

## 2022-03-01 LAB — LIPID PANEL
Chol/HDL Ratio: 5.3 ratio — ABNORMAL HIGH (ref 0.0–5.0)
Cholesterol, Total: 184 mg/dL (ref 100–199)
HDL: 35 mg/dL — ABNORMAL LOW (ref >39)
LDL Chol Calc (NIH): 126 mg/dL — ABNORMAL HIGH (ref 0–99)
Triglycerides: 129 mg/dL (ref 0–149)
VLDL Cholesterol Cal: 23 mg/dL (ref 5–40)

## 2022-03-01 LAB — CBC WITH DIFFERENTIAL/PLATELET
Basophils Absolute: 0.1 10*3/uL (ref 0.0–0.2)
Basos: 1 %
EOS (ABSOLUTE): 0.4 10*3/uL (ref 0.0–0.4)
Eos: 5 %
Hematocrit: 41 % (ref 37.5–51.0)
Hemoglobin: 13.9 g/dL (ref 13.0–17.7)
Immature Grans (Abs): 0 10*3/uL (ref 0.0–0.1)
Immature Granulocytes: 0 %
Lymphocytes Absolute: 1.9 10*3/uL (ref 0.7–3.1)
Lymphs: 27 %
MCH: 31.2 pg (ref 26.6–33.0)
MCHC: 33.9 g/dL (ref 31.5–35.7)
MCV: 92 fL (ref 79–97)
Monocytes Absolute: 0.5 10*3/uL (ref 0.1–0.9)
Monocytes: 7 %
Neutrophils Absolute: 4.3 10*3/uL (ref 1.4–7.0)
Neutrophils: 60 %
Platelets: 266 10*3/uL (ref 150–450)
RBC: 4.45 x10E6/uL (ref 4.14–5.80)
RDW: 11.9 % (ref 11.6–15.4)
WBC: 7.2 10*3/uL (ref 3.4–10.8)

## 2022-03-01 LAB — COMPREHENSIVE METABOLIC PANEL
ALT: 34 IU/L (ref 0–44)
AST: 29 IU/L (ref 0–40)
Albumin/Globulin Ratio: 1.9 (ref 1.2–2.2)
Albumin: 4.4 g/dL (ref 3.9–4.9)
Alkaline Phosphatase: 93 IU/L (ref 44–121)
BUN/Creatinine Ratio: 15 (ref 10–24)
BUN: 17 mg/dL (ref 8–27)
Bilirubin Total: 0.7 mg/dL (ref 0.0–1.2)
CO2: 29 mmol/L (ref 20–29)
Calcium: 9.6 mg/dL (ref 8.6–10.2)
Chloride: 99 mmol/L (ref 96–106)
Creatinine, Ser: 1.17 mg/dL (ref 0.76–1.27)
Globulin, Total: 2.3 g/dL (ref 1.5–4.5)
Glucose: 98 mg/dL (ref 70–99)
Potassium: 4.1 mmol/L (ref 3.5–5.2)
Sodium: 143 mmol/L (ref 134–144)
Total Protein: 6.7 g/dL (ref 6.0–8.5)
eGFR: 68 mL/min/{1.73_m2} (ref >59)

## 2022-03-01 LAB — CARDIOVASCULAR RISK ASSESSMENT

## 2022-03-01 LAB — VITAMIN D 25 HYDROXY (VIT D DEFICIENCY, FRACTURES): Vit D, 25-Hydroxy: 22.5 ng/mL — ABNORMAL LOW (ref 30.0–100.0)

## 2022-03-01 LAB — TSH: TSH: 13.2 u[IU]/mL — ABNORMAL HIGH (ref 0.450–4.500)

## 2022-03-01 MED ORDER — VITAMIN D (ERGOCALCIFEROL) 1.25 MG (50000 UNIT) PO CAPS: 50000.0000 [IU] | ORAL_CAPSULE | ORAL | 5 refills | Status: DC

## 2022-03-06 ENCOUNTER — Other Ambulatory Visit: Payer: Self-pay | Admitting: Physician Assistant

## 2022-03-06 DIAGNOSIS — E038 Other specified hypothyroidism: Secondary | ICD-10-CM

## 2022-03-06 MED ORDER — LEVOTHYROXINE SODIUM 200 MCG PO TABS: 200.0000 ug | ORAL_TABLET | Freq: Every day | ORAL | 0 refills | Status: DC

## 2022-03-09 ENCOUNTER — Ambulatory Visit: Payer: Medicare Other | Admitting: Cardiology

## 2022-03-16 ENCOUNTER — Ambulatory Visit (INDEPENDENT_AMBULATORY_CARE_PROVIDER_SITE_OTHER): Payer: Medicare Other

## 2022-03-16 VITALS — BP 136/78 | HR 83 | Resp 16 | Ht 72.0 in | Wt 215.6 lb

## 2022-03-16 DIAGNOSIS — Z0000 Encounter for general adult medical examination without abnormal findings: Secondary | ICD-10-CM

## 2022-03-16 NOTE — Progress Notes (Signed)
Subjective:   Mathew Wagner is a 66 y.o. male who presents for an Initial Medicare Annual Wellness Visit.  This wellness visit is conducted by a nurse.  The patient's medications were reviewed and reconciled since the patient's last visit.  History details were provided by the patient.  The history appears to be reliable.    Medical History: Patient history and Family history was reviewed  Medications, Allergies, and preventative health maintenance was reviewed and updated.  Cardiac Risk Factors include: advanced age (>23men, >26 women);hypertension;male gender     Objective:    Today's Vitals   03/16/22 0919 03/16/22 0932  BP: (!) 148/80 136/78  Pulse: 83   Resp: 16   SpO2: 95%   Weight: 215 lb 9.6 oz (97.8 kg)   Height: 6' (1.829 m)   PainSc: 0-No pain    Body mass index is 29.24 kg/m.     03/24/2019    9:33 AM  Advanced Directives  Does Patient Have a Medical Advance Directive? Yes    Current Medications (verified) Outpatient Encounter Medications as of 03/16/2022  Medication Sig   amLODipine (NORVASC) 10 MG tablet TAKE 1 TABLET BY MOUTH EVERY DAY INSURANCE: 3/24   hydrochlorothiazide (HYDRODIURIL) 25 MG tablet TAKE 1 TABLET BY MOUTH EVERY DAY FOR BLOOD PRESSURE   levothyroxine (SYNTHROID) 200 MCG tablet Take 1 tablet (200 mcg total) by mouth daily.   lisinopril (ZESTRIL) 10 MG tablet TAKE 1 TABLET BY MOUTH EVERY DAY   pantoprazole (PROTONIX) 40 MG tablet Take 1 tablet (40 mg total) by mouth daily.   Vitamin D, Ergocalciferol, (DRISDOL) 1.25 MG (50000 UNIT) CAPS capsule Take 1 capsule (50,000 Units total) by mouth every 7 (seven) days.   No facility-administered encounter medications on file as of 03/16/2022.    Allergies (verified) Patient has no known allergies.   History: Past Medical History:  Diagnosis Date   Allergy    GERD (gastroesophageal reflux disease)    Hypertension    Thyroid disease    Past Surgical History:  Procedure Laterality Date    BACK SURGERY     L4 and L5 possibly   COLONOSCOPY     Dr Kinnie Scales- 2013   ESOPHAGOGASTRODUODENOSCOPY     Dr Chales Abrahams- possibly 1990's   NECK SURGERY     1990's.    TONSILLECTOMY     Family History  Problem Relation Age of Onset   Diabetes Mother    Lung cancer Father    Lung cancer Sister    Colon cancer Neg Hx    Esophageal cancer Neg Hx    Rectal cancer Neg Hx    Stomach cancer Neg Hx    Social History   Socioeconomic History   Marital status: Married    Spouse name: [name]   Number of children: 3   Years of education: Not on file   Highest education level: Not on file  Occupational History   Occupation: Engineer, water  Tobacco Use   Smoking status: Former    Types: Cigarettes   Smokeless tobacco: Never   Tobacco comments:    quit about 20 years ago  Vaping Use   Vaping Use: Never used  Substance and Sexual Activity   Alcohol use: Yes    Comment: rare   Drug use: Not Currently   Sexual activity: Not on file  Other Topics Concern   Not on file  Social History Narrative   Not on file   Social Determinants of Health   Financial  Resource Strain: Low Risk  (03/16/2022)   Overall Financial Resource Strain (CARDIA)    Difficulty of Paying Living Expenses: Not hard at all  Food Insecurity: No Food Insecurity (03/16/2022)   Hunger Vital Sign    Worried About Running Out of Food in the Last Year: Never true    Ran Out of Food in the Last Year: Never true  Transportation Needs: No Transportation Needs (03/16/2022)   PRAPARE - Hydrologist (Medical): No    Lack of Transportation (Non-Medical): No  Physical Activity: Sufficiently Active (03/16/2022)   Exercise Vital Sign    Days of Exercise per Week: 5 days    Minutes of Exercise per Session: 40 min  Stress: No Stress Concern Present (03/16/2022)   Young    Feeling of Stress : Not at all  Social  Connections: Not on file    Tobacco Counseling Counseling given: Not Answered Tobacco comments: quit about 20 years ago   Clinical Intake:  Pre-visit preparation completed: Yes Pain : No/denies pain Pain Score: 0-No pain   BMI - recorded: 29.24 Nutritional Status: BMI 25 -29 Overweight Nutritional Risks: None Diabetes: No How often do you need to have someone help you when you read instructions, pamphlets, or other written materials from your doctor or pharmacy?: 1 - Never Interpreter Needed?: No     Activities of Daily Living    03/16/2022    9:27 AM  In your present state of health, do you have any difficulty performing the following activities:  Hearing? 0  Vision? 0  Difficulty concentrating or making decisions? 0  Walking or climbing stairs? 0  Dressing or bathing? 0  Doing errands, shopping? 0  Preparing Food and eating ? N  Using the Toilet? N  In the past six months, have you accidently leaked urine? N  Do you have problems with loss of bowel control? N  Managing your Medications? N  Managing your Finances? N  Housekeeping or managing your Housekeeping? N    Patient Care Team: Marge Duncans, Hershal Coria as PCP - General (Physician Assistant) Jackquline Denmark, MD as Consulting Physician (Gastroenterology)     Assessment:   This is a routine wellness examination for [place].  Hearing/Vision screen No results found.  Dietary issues and exercise activities discussed: Current Exercise Habits: The patient has a physically strenuous job, but has no regular exercise apart from work., Exercise limited by: None identified   Depression Screen    03/16/2022    9:25 AM 08/25/2021    9:12 AM 04/01/2020    9:00 AM  PHQ 2/9 Scores  PHQ - 2 Score 0 0 0    Fall Risk    03/16/2022    9:27 AM 08/25/2021    9:12 AM  Fall Risk   Falls in the past year? 1 0  Number falls in past yr: 0 0  Injury with Fall? 0 0  Risk for fall due to : No Fall Risks   Follow up Falls  evaluation completed;Education provided     [place]:  Home free of loose throw rugs in walkways, pet beds, electrical cords, etc? No  Adequate lighting in your home to reduce risk of falls? Yes   ASSISTIVE DEVICES UTILIZED TO PREVENT FALLS:  Life alert? No  Use of a cane, walker or w/c? No  Grab bars in the bathroom? No  Shower chair or bench in  shower? No  Elevated toilet seat or a handicapped toilet? No   Gait slow and steady without use of assistive device  Cognitive Function:        03/16/2022    9:28 AM  6CIT Screen  What Year? 0 points  What month? 0 points  What time? 0 points  Count back from 20 0 points  Months in reverse 0 points  Repeat phrase 0 points  Total Score 0 points    Immunizations Immunization History  Administered Date(s) Administered   Fluad Quad(high Dose 65+) 04/01/2020, 02/28/2022   Influenza Inj Mdck Quad Pf 07/02/2019   Influenza-Unspecified 03/25/2018   PNEUMOCOCCAL CONJUGATE-20 12/05/2021   Pneumococcal Conjugate-13 03/20/2018, 04/01/2020   Pneumococcal Polysaccharide-23 08/25/2021    TDAP status: Due, Education has been provided regarding the importance of this vaccine. Advised may receive this vaccine at local pharmacy or Health Dept. Aware to provide a copy of the vaccination record if obtained from local pharmacy or Health Dept. Verbalized acceptance and understanding.  Flu Vaccine status: Up to date  Pneumococcal vaccine status: Up to date  Covid-19 vaccine status: Declined, Education has been provided regarding the importance of this vaccine but patient still declined. Advised may receive this vaccine at local pharmacy or Health Dept.or vaccine clinic. Aware to provide a copy of the vaccination record if obtained from local pharmacy or Health Dept. Verbalized acceptance and understanding.  Qualifies for Shingles Vaccine? Yes   Zostavax completed No   Shingrix Completed?: No.    Education has  been provided regarding the importance of this vaccine. Patient has been advised to call insurance company to determine out of pocket expense if they have not yet received this vaccine. Advised may also receive vaccine at local pharmacy or Health Dept. Verbalized acceptance and understanding.  Screening Tests Health Maintenance  Topic Date Due   Medicare Annual Wellness (AWV)  Never done   COLONOSCOPY (Pts 45-80yrs Insurance coverage will need to be confirmed)  Never done   Zoster Vaccines- Shingrix (1 of 2) 05/31/2022 (Originally 02/13/2005)   TETANUS/TDAP  08/26/2022 (Originally 02/13/1974)   DEXA SCAN  03/01/2023 (Originally 02/07/2022)   Pneumonia Vaccine 67+ Years old  Completed   INFLUENZA VACCINE  Completed   Hepatitis C Screening  Completed   HPV VACCINES  Aged Out   COVID-19 Vaccine  Discontinued    Health Maintenance  Health Maintenance Due  Topic Date Due   Medicare Annual Wellness (AWV)  Never done   COLONOSCOPY (Pts 45-20yrs Insurance coverage will need to be confirmed)  Never done    Colorectal cancer screening: Type of screening: Colonoscopy. Due  Lung Cancer Screening: (Low Dose CT Chest recommended if Age 75-80 years, 30 pack-year currently smoking OR have quit w/in 15years.) does not qualify.   Additional Screening:  Hepatitis C Screening: does qualify; Completed 2013  Vision Screening: Recommended annual ophthalmology exams for early detection of glaucoma and other disorders of the eye. Is the patient up to date with their annual eye exam?  Yes   Dental Screening: Recommended annual dental exams for proper oral hygiene  Community Resource Referral / Chronic Care Management: CRR required this visit?  No   CCM required this visit?  No      Plan:    1- Referral previously made by PCP for Colonoscopy  I have personally reviewed and noted the following in the patient's chart:   Medical and social history Use of alcohol, tobacco or illicit drugs   Current medications and supplements  including opioid prescriptions.  Functional ability and status Nutritional status Physical activity Advanced directives List of other physicians Hospitalizations, surgeries, and ER visits in previous 12 months Vitals Screenings to include cognitive, depression, and falls Referrals and appointments  In addition, I have reviewed and discussed with patient certain preventive protocols, quality metrics, and best practice recommendations. A written personalized care plan for preventive services as well as general preventive health recommendations were provided to patient.     Erie Noe, LPN   [id number]

## 2022-03-20 NOTE — Patient Instructions (Signed)
Health Maintenance After Age 67 After age 67, you are at a higher risk for certain long-term diseases and infections as well as injuries from falls. Falls are a major cause of broken bones and head injuries in people who are older than age 67. Getting regular preventive care can help to keep you healthy and well. Preventive care includes getting regular testing and making lifestyle changes as recommended by your health care provider. Talk with your health care provider about: Which screenings and tests you should have. A screening is a test that checks for a disease when you have no symptoms. A diet and exercise plan that is right for you. What should I know about screenings and tests to prevent falls? Screening and testing are the best ways to find a health problem early. Early diagnosis and treatment give you the best chance of managing medical conditions that are common after age 67. Certain conditions and lifestyle choices may make you more likely to have a fall. Your health care provider may recommend: Regular vision checks. Poor vision and conditions such as cataracts can make you more likely to have a fall. If you wear glasses, make sure to get your prescription updated if your vision changes. Medicine review. Work with your health care provider to regularly review all of the medicines you are taking, including over-the-counter medicines. Ask your health care provider about any side effects that may make you more likely to have a fall. Tell your health care provider if any medicines that you take make you feel dizzy or sleepy. Strength and balance checks. Your health care provider may recommend certain tests to check your strength and balance while standing, walking, or changing positions. Foot health exam. Foot pain and numbness, as well as not wearing proper footwear, can make you more likely to have a fall. Screenings, including: Osteoporosis screening. Osteoporosis is a condition that causes  the bones to get weaker and break more easily. Blood pressure screening. Blood pressure changes and medicines to control blood pressure can make you feel dizzy. Depression screening. You may be more likely to have a fall if you have a fear of falling, feel depressed, or feel unable to do activities that you used to do. Alcohol use screening. Using too much alcohol can affect your balance and may make you more likely to have a fall. Follow these instructions at home: Lifestyle Do not drink alcohol if: Your health care provider tells you not to drink. If you drink alcohol: Limit how much you have to: 0-1 drink a day for women. 0-2 drinks a day for men. Know how much alcohol is in your drink. In the [place] one drink equals one 12 oz bottle of beer (355 mL), one 5 oz glass of wine (148 mL), or one 1 oz glass of hard liquor (44 mL). Do not use any products that contain nicotine or tobacco. These products include cigarettes, chewing tobacco, and vaping devices, such as e-cigarettes. If you need help quitting, ask your health care provider. Activity  Follow a regular exercise program to stay fit. This will help you maintain your balance. Ask your health care provider what types of exercise are appropriate for you. If you need a cane or walker, use it as recommended by your health care provider. Wear supportive shoes that have nonskid soles. Safety  Remove any tripping hazards, such as rugs, cords, and clutter. Install safety equipment such as grab bars in bathrooms and safety rails on stairs. Keep rooms and walkways   well-lit. General instructions Talk with your health care provider about your risks for falling. Tell your health care provider if: You fall. Be sure to tell your health care provider about all falls, even ones that seem minor. You feel dizzy, tiredness (fatigue), or off-balance. Take over-the-counter and prescription medicines only as told by your health care provider. These include  supplements. Eat a healthy diet and maintain a healthy weight. A healthy diet includes low-fat dairy products, low-fat (lean) meats, and fiber from whole grains, beans, and lots of fruits and vegetables. Stay current with your vaccines. Schedule regular health, dental, and eye exams. Summary Having a healthy lifestyle and getting preventive care can help to protect your health and wellness after age 67. Screening and testing are the best way to find a health problem early and help you avoid having a fall. Early diagnosis and treatment give you the best chance for managing medical conditions that are more common for people who are older than age 67. Falls are a major cause of broken bones and head injuries in people who are older than age 67. Take precautions to prevent a fall at home. Work with your health care provider to learn what changes you can make to improve your health and wellness and to prevent falls. This information is not intended to replace advice given to you by your health care provider. Make sure you discuss any questions you have with your health care provider. Document Revised: 09/06/2020 Document Reviewed: 09/06/2020 Elsevier Patient Education  2023 Elsevier Inc.  

## 2022-03-22 ENCOUNTER — Ambulatory Visit: Payer: Medicare Other | Admitting: Cardiology

## 2022-03-25 ENCOUNTER — Other Ambulatory Visit: Payer: Self-pay | Admitting: Physician Assistant

## 2022-03-25 DIAGNOSIS — I10 Essential (primary) hypertension: Secondary | ICD-10-CM

## 2022-04-10 ENCOUNTER — Ambulatory Visit: Payer: Medicare Other | Attending: Cardiology | Admitting: Cardiology

## 2022-04-10 ENCOUNTER — Encounter: Payer: Self-pay | Admitting: Cardiology

## 2022-04-10 VITALS — BP 144/86 | HR 74 | Ht 72.0 in | Wt 218.6 lb

## 2022-04-10 DIAGNOSIS — E785 Hyperlipidemia, unspecified: Secondary | ICD-10-CM | POA: Diagnosis not present

## 2022-04-10 DIAGNOSIS — R0789 Other chest pain: Secondary | ICD-10-CM | POA: Diagnosis not present

## 2022-04-10 DIAGNOSIS — Z87891 Personal history of nicotine dependence: Secondary | ICD-10-CM | POA: Insufficient documentation

## 2022-04-10 DIAGNOSIS — R400 Somnolence: Secondary | ICD-10-CM

## 2022-04-10 DIAGNOSIS — I10 Essential (primary) hypertension: Secondary | ICD-10-CM | POA: Diagnosis not present

## 2022-04-10 DIAGNOSIS — R0609 Other forms of dyspnea: Secondary | ICD-10-CM

## 2022-04-10 MED ORDER — ATORVASTATIN CALCIUM 10 MG PO TABS: 10.0000 mg | ORAL_TABLET | Freq: Every day | ORAL | 3 refills | Status: DC

## 2022-04-10 MED ORDER — METOPROLOL TARTRATE 100 MG PO TABS: ORAL_TABLET | ORAL | 0 refills | Status: DC

## 2022-04-10 MED ORDER — ASPIRIN 81 MG PO TBEC: 81.0000 mg | DELAYED_RELEASE_TABLET | Freq: Every day | ORAL | 3 refills | Status: AC

## 2022-04-10 NOTE — Progress Notes (Unsigned)
Cardiology Consultation:    Date:  04/10/2022   ID:  EVERTTE Wagner, DOB 17-Sep-1955, MRN [id number]  PCP:  Marianne Sofia, PA-C  Cardiologist:  Gypsy Balsam, MD   Referring MD: Marianne Sofia, PA-C   Chief Complaint  Patient presents with   Chest Pain    History of Present Illness:    Mathew Wagner is a 66 y.o. male who is being seen today for the evaluation of chest pain at the request of Marianne Sofia, Cordelia Poche.  Past medical history significant for essential hypertension, dyslipidemia, family history of premature coronary artery disease who was referred to [place] because of chest pain.  He described chest pain as sharp sometimes heavy sensation in the middle of the chest radiating towards the left side.  It happened in different situations sometimes when he sits sometimes when he walks.  He works as a Production designer, theatre/television/film man when he does walk a lot he will get tired and sometimes rarely he developed chest pain.  Usually when he sits after he eats he will develop chest pain.  He graded sensation 7 in scale up to 10 it lasts up to 1 minute.  There is no shortness of breath no sweating associated with this sensation there is no relieving or aggravating factor simply goes away with time. His risk factor is family history of coronary artery disease, apparently his mother got bypass surgery before age of 69, he is a ex-smoker quit smoking 25 years ago because cigarettes was simply too expensive that is why he decided to quit, he does have essential hypertension, dyslipidemia.  He does not exercise on the regular basis but he is a maintenance man, therefore, he have to walk a lot.  He is not on any special diet  Past Medical History:  Diagnosis Date   Allergy    GERD (gastroesophageal reflux disease)    Hypertension    Thyroid disease     Past Surgical History:  Procedure Laterality Date   BACK SURGERY     L4 and L5 possibly   COLONOSCOPY     Dr Kinnie Scales- 2013   ESOPHAGOGASTRODUODENOSCOPY     Dr  Chales Abrahams- possibly 1990's   NECK SURGERY     1990's.    TONSILLECTOMY      Current Medications: Current Meds  Medication Sig   amLODipine (NORVASC) 10 MG tablet TAKE 1 TABLET BY MOUTH EVERY DAY INSURANCE: 3/24   hydrochlorothiazide (HYDRODIURIL) 25 MG tablet TAKE 1 TABLET BY MOUTH EVERY DAY FOR BLOOD PRESSURE   levothyroxine (SYNTHROID) 200 MCG tablet Take 1 tablet (200 mcg total) by mouth daily.   lisinopril (ZESTRIL) 10 MG tablet TAKE 1 TABLET BY MOUTH EVERY DAY   pantoprazole (PROTONIX) 40 MG tablet Take 1 tablet (40 mg total) by mouth daily.   Vitamin D, Ergocalciferol, (DRISDOL) 1.25 MG (50000 UNIT) CAPS capsule Take 1 capsule (50,000 Units total) by mouth every 7 (seven) days.     Allergies:   Patient has no known allergies.   Social History   Socioeconomic History   Marital status: Married    Spouse name: Mathew Wagner   Number of children: 3   Years of education: Not on file   Highest education level: Not on file  Occupational History   Occupation: Engineer, water  Tobacco Use   Smoking status: Former    Types: Cigarettes   Smokeless tobacco: Never   Tobacco comments:    quit about 20 years ago  Vaping Use   Vaping  Use: Never used  Substance and Sexual Activity   Alcohol use: Yes    Comment: rare   Drug use: Not Currently   Sexual activity: Yes  Other Topics Concern   Not on file  Social History Narrative   Not on file   Social Determinants of Health   Financial Resource Strain: Low Risk  (03/16/2022)   Overall Financial Resource Strain (CARDIA)    Difficulty of Paying Living Expenses: Not hard at all  Food Insecurity: No Food Insecurity (03/16/2022)   Hunger Vital Sign    Worried About Running Out of Food in the Last Year: Never true    Ran Out of Food in the Last Year: Never true  Transportation Needs: No Transportation Needs (03/16/2022)   PRAPARE - Hydrologist (Medical): No    Lack of Transportation (Non-Medical): No   Physical Activity: Sufficiently Active (03/16/2022)   Exercise Vital Sign    Days of Exercise per Week: 5 days    Minutes of Exercise per Session: 40 min  Stress: No Stress Concern Present (03/16/2022)   [place]    Feeling of Stress : Not at all  Social Connections: Not on file     Family History: The patient's family history includes Diabetes in his mother; Lung cancer in his father and sister. There is no history of Colon cancer, Esophageal cancer, Rectal cancer, or Stomach cancer. ROS:   Please see the history of present illness.    All 14 point review of systems negative except as described per history of present illness.  EKGs/Labs/Other Studies Reviewed:    The following studies were reviewed today:   EKG:  EKG is  ordered today.  The ekg ordered today demonstrates normal sinus rhythm, normal P interval, normal QS complex duration morphology, no ST segment changes  Recent Labs: 02/28/2022: ALT 34; BUN 17; Creatinine, Ser 1.17; Hemoglobin 13.9; Platelets 266; Potassium 4.1; Sodium 143; TSH 13.200  Recent Lipid Panel    Component Value Date/Time   CHOL 184 02/28/2022 0905   TRIG 129 02/28/2022 0905   HDL 35 (L) 02/28/2022 0905   CHOLHDL 5.3 (H) 02/28/2022 0905   LDLCALC 126 (H) 02/28/2022 0905    Physical Exam:    VS:  BP (!) 144/86 (BP Location: Left Arm, Patient Position: Sitting)   Pulse 74   Ht 6' (1.829 m)   Wt 218 lb 9.6 oz (99.2 kg)   SpO2 98%   BMI 29.65 kg/m     Wt Readings from Last 3 Encounters:  04/10/22 218 lb 9.6 oz (99.2 kg)  03/16/22 215 lb 9.6 oz (97.8 kg)  02/28/22 215 lb (97.5 kg)     GEN:  Well nourished, well developed in no acute distress HEENT: Normal NECK: No JVD; No carotid bruits LYMPHATICS: No lymphadenopathy CARDIAC: RRR, no murmurs, no rubs, no gallops RESPIRATORY:  Clear to auscultation without rales, wheezing or rhonchi  ABDOMEN: Soft, non-tender,  non-distended MUSCULOSKELETAL:  No edema; No deformity  SKIN: Warm and dry NEUROLOGIC:  Alert and oriented x 3 PSYCHIATRIC:  Normal affect   ASSESSMENT:    1. Atypical chest pain   2. Essential hypertension, benign   3. Dyslipidemia   4. History of smoking    PLAN:    In order of problems listed above:  Atypical chest pain this gentleman with multiple risk factors for coronary artery disease.  We had a long discussion about what to do  with the situation and we elected to proceed with coronary CT angio.  I will also ask him to start taking 1 baby aspirin every single day, as a part of ablation I will schedule him to have an echocardiogram done and the reason why I want to do this is the fact that he does have a swelling of lower extremities which is probably related to his amlodipine that he takes however when he make sure he does not have any cardiomyopathy. Snoring with stopping breathing at night plus excessive daytime somnolence.  He jokingly telling me that he is wife telling him that he is breathing many times at night, I strongly suspect sleep apnea, we will schedule him to have a sleep study. Dyslipidemia I did review K PN which show me his LDL 126 HDL 35 evidence of bed combination of cholesterol with multiple risk factors his risk is high enough to justify giving him cholesterol medication.  I will start him on Lipitor 10 mg daily. History of smoking, he abstain from smoking and I strongly encouraged him to stay away from it.   Medication Adjustments/Labs and Tests Ordered: Current medicines are reviewed at length with the patient today.  Concerns regarding medicines are outlined above.  No orders of the defined types were placed in this encounter.  No orders of the defined types were placed in this encounter.   Signed, Park Liter, MD, [facility] 04/10/2022 8:53 AM    [place]

## 2022-04-10 NOTE — Patient Instructions (Addendum)
Medication Instructions:  Your physician has recommended you make the following change in your medication:   START: Lipitor 10mg  1 tablet daily   START: Enteric Coated 81mg  Aspirin 1 tablet daily  TAKE: Metoprolol 100mg  1 tablet 2 hours prior to CT scan  HOLD: HCTZ the day of CT scan  *If you need a refill on your cardiac medications before your next appointment, please call your pharmacy*   Lab Work: Your physician recommends that you return for lab work in: 1 week prior to CT scan You need to have labs done when you are fasting.  You can come Monday through Friday 8:30 am to 12:00 pm and 1:15 to 4:30. You do not need to make an appointment as the order has already been placed. The labs you are going to have done are BMET   Testing/Procedures: Your physician has requested that you have cardiac CT. Cardiac computed tomography (CT) is a painless test that uses an x-ray machine to take clear, detailed pictures of your heart. For further information please visit . Please follow instruction sheet as given.    Your Cardiac CT will be scheduled at:   [facility] located off [facility] at the hospital.  Please arrive 30 minutes prior to your appointment time.  You can use the FREE valet parking offered at entrance to outpatient center (encouraged to control the heart rate for the test)   Please follow these instructions carefully (unless otherwise directed):  Hold all erectile dysfunction medications at least 3 days (72 hrs) prior to test.  On the Night Before the Test: Be sure to Drink plenty of water. Do not consume any caffeinated/decaffeinated beverages or chocolate 12 hours prior to your test. Do not take any antihistamines 12 hours prior to your test.  On the Day of the Test: Drink plenty of water until 1 hour prior to the test. Do not eat any food 4 hours prior to the test. No smoking 4 hours prior to test. You may take your  regular medications prior to the test.  Take metoprolol (Lopressor) two hours prior to test. HOLD Furosemide/Hydrochlorothiazide morning of the test. FEMALES- please wear underwire-free bra if available, avoid dresses & tight clothing. Wear plain shirt no beads, sparkles, rhinestones, metal or heavy embroidery.  After the Test: Drink plenty of water. After receiving IV contrast, you may experience a mild flushed feeling. This is normal. On occasion, you may experience a mild rash up to 24 hours after the test. This is not dangerous. If this occurs, you can take Benadryl 25 mg and increase your fluid intake. If you experience trouble breathing, this can be serious. If it is severe call 911 IMMEDIATELY. If it is mild, please call our office. If you take any of these medications: Glipizide/Metformin, Avandament, Glucavance, please do not take 48 hours after completing test unless otherwise instructed.  We will call to schedule your test 2-4 weeks out understanding that some insurance companies will need an authorization prior to the service being performed.      Follow-Up: At [facility], you and your health needs are our priority.  As part of our continuing mission to provide you with exceptional heart care, we have created designated Provider Care Teams.  These Care Teams include your primary Cardiologist (physician) and Advanced Practice Providers (APPs -  Physician Assistants and Nurse Practitioners) who all work together to provide you with the care you need, when you need it.  We recommend signing up  for the patient portal called "MyChart".  Sign up information is provided on this After Visit Summary.  MyChart is used to connect with patients for Virtual Visits (Telemedicine).  Patients are able to view lab/test results, encounter notes, upcoming appointments, etc.  Non-urgent messages can be sent to your provider as well.   To learn more about what you can do with MyChart, go to  [email or web address].    Your next appointment:   2 month(s)  The format for your next appointment:   In Person  Provider:   Gypsy Balsamobert Krasowski, MD    Other Instructions Cardiac CT Angiogram A cardiac CT angiogram is a procedure to look at the heart and the area around the heart. It may be done to help find the cause of chest pains or other symptoms of heart disease. During this procedure, a substance called contrast dye is injected into the blood vessels in the area to be checked. A large X-ray machine, called a CT scanner, then takes detailed pictures of the heart and the surrounding area. The procedure is also sometimes called a coronary CT angiogram, coronary artery scanning, or CTA. A cardiac CT angiogram allows the health care provider to see how well blood is flowing to and from the heart. The health care provider will be able to see if there are any problems, such as: Blockage or narrowing of the coronary arteries in the heart. Fluid around the heart. Signs of weakness or disease in the muscles, valves, and tissues of the heart. Tell a health care provider about: Any allergies you have. This is especially important if you have had a previous allergic reaction to contrast dye. All medicines you are taking, including vitamins, herbs, eye drops, creams, and over-the-counter medicines. Any blood disorders you have. Any surgeries you have had. Any medical conditions you have. Whether you are pregnant or may be pregnant. Any anxiety disorders, chronic pain, or other conditions you have that may increase your stress or prevent you from lying still. What are the risks? Generally, this is a safe procedure. However, problems may occur, including: Bleeding. Infection. Allergic reactions to medicines or dyes. Damage to other structures or organs. Kidney damage from the contrast dye that is used. Increased risk of cancer from radiation exposure. This risk is low. Talk with your health  care provider about: The risks and benefits of testing. How you can receive the lowest dose of radiation. What happens before the procedure? Wear comfortable clothing and remove any jewelry, glasses, dentures, and hearing aids. Follow instructions from your health care provider about eating and drinking. This may include: For 12 hours before the procedure -- avoid caffeine. This includes tea, coffee, soda, energy drinks, and diet pills. Drink plenty of water or other fluids that do not have caffeine in them. Being well hydrated can prevent complications. For 4-6 hours before the procedure -- stop eating and drinking. The contrast dye can cause nausea, but this is less likely if your stomach is empty. Ask your health care provider about changing or stopping your regular medicines. This is especially important if you are taking diabetes medicines, blood thinners, or medicines to treat problems with erections (erectile dysfunction). What happens during the procedure?  Hair on your chest may need to be removed so that small sticky patches called electrodes can be placed on your chest. These will transmit information that helps to monitor your heart during the procedure. An IV will be inserted into one of your veins. You might be given  a medicine to control your heart rate during the procedure. This will help to ensure that good images are obtained. You will be asked to lie on an exam table. This table will slide in and out of the CT machine during the procedure. Contrast dye will be injected into the IV. You might feel warm, or you may get a metallic taste in your mouth. You will be given a medicine called nitroglycerin. This will relax or dilate the arteries in your heart. The table that you are lying on will move into the CT machine tunnel for the scan. The person running the machine will give you instructions while the scans are being done. You may be asked to: Keep your arms above your head. Hold  your breath. Stay very still, even if the table is moving. When the scanning is complete, you will be moved out of the machine. The IV will be removed. The procedure may vary among health care providers and hospitals. What can I expect after the procedure? After your procedure, it is common to have: A metallic taste in your mouth from the contrast dye. A feeling of warmth. A headache from the nitroglycerin. Follow these instructions at home: Take over-the-counter and prescription medicines only as told by your health care provider. If you are told, drink enough fluid to keep your urine pale yellow. This will help to flush the contrast dye out of your body. Most people can return to their normal activities right after the procedure. Ask your health care provider what activities are safe for you. It is up to you to get the results of your procedure. Ask your health care provider, or the department that is doing the procedure, when your results will be ready. Keep all follow-up visits as told by your health care provider. This is important. Contact a health care provider if: You have any symptoms of allergy to the contrast dye. These include: Shortness of breath. Rash or hives. A racing heartbeat. Summary A cardiac CT angiogram is a procedure to look at the heart and the area around the heart. It may be done to help find the cause of chest pains or other symptoms of heart disease. During this procedure, a large X-ray machine, called a CT scanner, takes detailed pictures of the heart and the surrounding area after a contrast dye has been injected into blood vessels in the area. Ask your health care provider about changing or stopping your regular medicines before the procedure. This is especially important if you are taking diabetes medicines, blood thinners, or medicines to treat erectile dysfunction. If you are told, drink enough fluid to keep your urine pale yellow. This will help to flush  the contrast dye out of your body. This information is not intended to replace advice given to you by your health care provider. Make sure you discuss any questions you have with your health care provider. Document Revised: 08/04/2021 Document Reviewed: 12/11/2018 Elsevier Patient Education  2023 Elsevier Inc.  Your physician has requested that you have an echocardiogram. Echocardiography is a painless test that uses sound waves to create images of your heart. It provides your doctor with information about the size and shape of your heart and how well your heart's chambers and valves are working. This procedure takes approximately one hour. There are no restrictions for this procedure. Please do NOT wear cologne, perfume, aftershave, or lotions (deodorant is allowed). Please arrive 15 minutes prior to your appointment time.   Itamar Sleep Study-  Will call to begin test   Important Information About Sugar

## 2022-04-11 ENCOUNTER — Telehealth: Payer: Self-pay | Admitting: *Deleted

## 2022-04-11 NOTE — Telephone Encounter (Signed)
Notified Mathew Wagner ok to activate itamar device.  

## 2022-04-20 ENCOUNTER — Ambulatory Visit: Payer: Medicare Other

## 2022-04-20 ENCOUNTER — Telehealth: Payer: Self-pay | Admitting: Cardiology

## 2022-04-20 NOTE — Telephone Encounter (Signed)
Mathew Wagner from [facility] says she needs updated insurance info for upcoming ct

## 2022-04-21 NOTE — Telephone Encounter (Signed)
Faxed insurance information to [place] at [facility]

## 2022-04-27 ENCOUNTER — Ambulatory Visit: Payer: Medicare Other | Attending: Cardiology

## 2022-05-11 ENCOUNTER — Telehealth: Payer: Self-pay

## 2022-05-11 ENCOUNTER — Ambulatory Visit: Payer: Medicare Other

## 2022-05-11 DIAGNOSIS — J209 Acute bronchitis, unspecified: Secondary | ICD-10-CM | POA: Diagnosis not present

## 2022-05-11 NOTE — Telephone Encounter (Signed)
Pts wife called stating that Sire has a ST, [place]. SXS STARTED 3 DAYS AGO. DENIES ANY FEVER. She was notified that I do not see any opening today. I offered to send the providers a message to see if someone would be willing to work him in today. She was notified that if she does not want to do that then he can login to his MyChart to do a e-visit with a [facility] provider or go to UC if he is feeling that bad. The wife stated he doesn't want to wait, he will go to UC.

## 2022-05-18 ENCOUNTER — Ambulatory Visit: Payer: Medicare Other

## 2022-06-08 ENCOUNTER — Telehealth: Payer: Self-pay

## 2022-06-08 ENCOUNTER — Other Ambulatory Visit: Payer: Self-pay | Admitting: Physician Assistant

## 2022-06-08 DIAGNOSIS — E038 Other specified hypothyroidism: Secondary | ICD-10-CM

## 2022-06-08 NOTE — Telephone Encounter (Signed)
Reviewing CT schedule. Pt cancelled CT scan on 04/25/22. Pt has follow up appt with Dr. Agustin Cree in March 2024

## 2022-06-23 ENCOUNTER — Ambulatory Visit: Payer: Medicare Other | Admitting: Cardiology

## 2022-06-25 DIAGNOSIS — M4602 Spinal enthesopathy, cervical region: Secondary | ICD-10-CM | POA: Diagnosis not present

## 2022-06-25 DIAGNOSIS — R519 Headache, unspecified: Secondary | ICD-10-CM | POA: Diagnosis not present

## 2022-06-25 DIAGNOSIS — M25571 Pain in right ankle and joints of right foot: Secondary | ICD-10-CM | POA: Diagnosis not present

## 2022-06-25 DIAGNOSIS — S199XXA Unspecified injury of neck, initial encounter: Secondary | ICD-10-CM | POA: Diagnosis not present

## 2022-06-25 DIAGNOSIS — M5030 Other cervical disc degeneration, unspecified cervical region: Secondary | ICD-10-CM | POA: Diagnosis not present

## 2022-06-25 DIAGNOSIS — S0003XA Contusion of scalp, initial encounter: Secondary | ICD-10-CM | POA: Diagnosis not present

## 2022-06-25 DIAGNOSIS — R600 Localized edema: Secondary | ICD-10-CM | POA: Diagnosis not present

## 2022-06-25 DIAGNOSIS — S0181XA Laceration without foreign body of other part of head, initial encounter: Secondary | ICD-10-CM | POA: Diagnosis not present

## 2022-06-25 DIAGNOSIS — S0083XA Contusion of other part of head, initial encounter: Secondary | ICD-10-CM | POA: Diagnosis not present

## 2022-06-25 DIAGNOSIS — S022XXB Fracture of nasal bones, initial encounter for open fracture: Secondary | ICD-10-CM | POA: Diagnosis not present

## 2022-06-25 DIAGNOSIS — W19XXXA Unspecified fall, initial encounter: Secondary | ICD-10-CM | POA: Diagnosis not present

## 2022-06-25 DIAGNOSIS — S82891A Other fracture of right lower leg, initial encounter for closed fracture: Secondary | ICD-10-CM | POA: Diagnosis not present

## 2022-06-25 DIAGNOSIS — S022XXA Fracture of nasal bones, initial encounter for closed fracture: Secondary | ICD-10-CM | POA: Diagnosis not present

## 2022-07-04 ENCOUNTER — Ambulatory Visit: Payer: Medicare Other

## 2022-07-12 ENCOUNTER — Ambulatory Visit: Payer: Medicare Other | Admitting: Physician Assistant

## 2022-07-25 ENCOUNTER — Telehealth: Payer: Self-pay | Admitting: *Deleted

## 2022-07-25 ENCOUNTER — Telehealth: Payer: Self-pay

## 2022-07-25 NOTE — Telephone Encounter (Signed)
I called and LM to return my call to follow up on his Itamar study.

## 2022-07-25 NOTE — Telephone Encounter (Signed)
Secure chat message sent to Mathew Wagner that patient has not done itamar device given to I'm in December. Please contact.

## 2022-08-01 ENCOUNTER — Ambulatory Visit: Payer: Medicare Other | Attending: Cardiology

## 2022-08-01 DIAGNOSIS — R0609 Other forms of dyspnea: Secondary | ICD-10-CM

## 2022-08-01 LAB — ECHOCARDIOGRAM COMPLETE
Area-P 1/2: 3.39 cm2
S' Lateral: 2.8 cm

## 2022-08-10 ENCOUNTER — Telehealth: Payer: Self-pay | Admitting: *Deleted

## 2022-08-10 NOTE — Telephone Encounter (Signed)
Secure chat message sent to Mathew Wagner to sent message to billing dept for itamar device given to him in December. Patient has not done nor has he returned a call to her.

## 2022-08-16 ENCOUNTER — Other Ambulatory Visit: Payer: Self-pay | Admitting: Physician Assistant

## 2022-08-16 DIAGNOSIS — I10 Essential (primary) hypertension: Secondary | ICD-10-CM

## 2022-08-16 DIAGNOSIS — K219 Gastro-esophageal reflux disease without esophagitis: Secondary | ICD-10-CM

## 2022-08-25 ENCOUNTER — Ambulatory Visit: Payer: Medicare Other | Admitting: Cardiology

## 2022-08-30 ENCOUNTER — Ambulatory Visit: Payer: Medicare Other | Admitting: Physician Assistant

## 2022-09-07 ENCOUNTER — Encounter: Payer: Self-pay | Admitting: Physician Assistant

## 2022-09-07 ENCOUNTER — Ambulatory Visit (INDEPENDENT_AMBULATORY_CARE_PROVIDER_SITE_OTHER): Payer: Medicare Other | Admitting: Physician Assistant

## 2022-09-07 VITALS — BP 138/88 | HR 76 | Temp 97.2°F | Ht 72.0 in | Wt 219.0 lb

## 2022-09-07 DIAGNOSIS — H6121 Impacted cerumen, right ear: Secondary | ICD-10-CM | POA: Diagnosis not present

## 2022-09-07 DIAGNOSIS — I10 Essential (primary) hypertension: Secondary | ICD-10-CM | POA: Diagnosis not present

## 2022-09-07 DIAGNOSIS — E782 Mixed hyperlipidemia: Secondary | ICD-10-CM

## 2022-09-07 DIAGNOSIS — Z125 Encounter for screening for malignant neoplasm of prostate: Secondary | ICD-10-CM

## 2022-09-07 DIAGNOSIS — E559 Vitamin D deficiency, unspecified: Secondary | ICD-10-CM

## 2022-09-07 DIAGNOSIS — E038 Other specified hypothyroidism: Secondary | ICD-10-CM | POA: Diagnosis not present

## 2022-09-07 NOTE — Progress Notes (Signed)
Subjective:  Patient ID: Mathew Wagner, male    DOB: [birth year]  Age: 67 y.o. MRN: [id number]  Chief Complaint  Patient presents with   Medical Management of Chronic Issues    HPI  Pt presents for follow up of hypertension. The patient is tolerating the medication well without side effects. Compliance with treatment has been good; including taking medication as directed , maintains a healthy diet and regular exercise regimen , and following up as directed. Pt is currently on norvasc 10mg  qd, hctz 25mg  qd, zestril 10mg  qd Denies chest pain/dyspnea  Pt with history of GERD   pt states that he stopped omeprazole but is not really having symptoms at this time - says he has not seen GI yet which he was scheduled to see and is also due for screening colonoscopy  Pt with history of thyroid disease - currently on synthroid 200 mcg qd - due for labwork  It is noted that pt was diagnosed with osteopenia in 2018 by dexa scan.  He is a prior smoker but no other risk factors.  At this time pt would like to defer dexa scan  Pt with history of vit D def - is taking weekly supplement --- due for labwork  Pt with history of hyperlipidemia - however he was supposed to be taking lipitor but is not currently taking  Pt complains of right ear feeling stopped up Current Outpatient Medications on File Prior to Visit  Medication Sig Dispense Refill   amLODipine (NORVASC) 10 MG tablet TAKE 1 TABLET BY MOUTH EVERY DAY INSURANCE: 3/24 90 tablet 1   aspirin EC 81 MG tablet Take 1 tablet (81 mg total) by mouth daily. Swallow whole. 90 tablet 3   hydrochlorothiazide (HYDRODIURIL) 25 MG tablet TAKE 1 TABLET BY MOUTH EVERY DAY FOR BLOOD PRESSURE 90 tablet 0   levothyroxine (SYNTHROID) 200 MCG tablet TAKE 1 TABLET BY MOUTH EVERY DAY 90 tablet 0   lisinopril (ZESTRIL) 10 MG tablet TAKE 1 TABLET BY MOUTH EVERY DAY 90 tablet 0   Vitamin D, Ergocalciferol, (DRISDOL) 1.25 MG (50000 UNIT) CAPS capsule Take 1 capsule  (50,000 Units total) by mouth every 7 (seven) days. 5 capsule 5   No current facility-administered medications on file prior to visit.   Past Medical History:  Diagnosis Date   Allergy    GERD (gastroesophageal reflux disease)    Hypertension    Thyroid disease    Past Surgical History:  Procedure Laterality Date   BACK SURGERY     L4 and L5 possibly   COLONOSCOPY     Dr Kinnie Scales- 2013   ESOPHAGOGASTRODUODENOSCOPY     Dr Chales Abrahams- possibly 1990's   NECK SURGERY     1990's.    TONSILLECTOMY      Family History  Problem Relation Age of Onset   Diabetes Mother    Lung cancer Father    Lung cancer Sister    Colon cancer Neg Hx    Esophageal cancer Neg Hx    Rectal cancer Neg Hx    Stomach cancer Neg Hx    Social History   Socioeconomic History   Marital status: Married    Spouse name: [name]   Number of children: 3   Years of education: Not on file   Highest education level: Not on file  Occupational History   Occupation: Engineer, water  Tobacco Use   Smoking status: Former    Types: Cigarettes   Smokeless tobacco: Never  Tobacco comments:    quit about 20 years ago  Vaping Use   Vaping Use: Never used  Substance and Sexual Activity   Alcohol use: Yes    Comment: rare   Drug use: Not Currently   Sexual activity: Yes  Other Topics Concern   Not on file  Social History Narrative   Not on file   Social Determinants of Health   Financial Resource Strain: Low Risk  (03/16/2022)   Overall Financial Resource Strain (CARDIA)    Difficulty of Paying Living Expenses: Not hard at all  Food Insecurity: No Food Insecurity (03/16/2022)   Hunger Vital Sign    Worried About Running Out of Food in the Last Year: Never true    Ran Out of Food in the Last Year: Never true  Transportation Needs: No Transportation Needs (03/16/2022)   PRAPARE - Administrator, Civil Service (Medical): No    Lack of Transportation (Non-Medical): No  Physical Activity:  Sufficiently Active (03/16/2022)   Exercise Vital Sign    Days of Exercise per Week: 5 days    Minutes of Exercise per Session: 40 min  Stress: No Stress Concern Present (03/16/2022)   [employer] of Occupational Health - Occupational Stress Questionnaire    Feeling of Stress : Not at all  Social Connections: Not on file    CONSTITUTIONAL: Negative for chills, fatigue, fever, unintentional weight gain and unintentional weight loss.  E/N/T: see HPI CARDIOVASCULAR: Negative for chest pain, dizziness, palpitations and pedal edema.  RESPIRATORY: Negative for recent cough and dyspnea.  GASTROINTESTINAL: Negative for abdominal pain, acid reflux symptoms, constipation, diarrhea, nausea and vomiting.  MSK: Negative for arthralgias and myalgias.  INTEGUMENTARY: Negative for rash.  NEUROLOGICAL: Negative for dizziness and headaches.  PSYCHIATRIC: Negative for sleep disturbance and to question depression screen.  Negative for depression, negative for anhedonia.       Objective:  PHYSICAL EXAM:   VS: BP 138/88 (BP Location: Left Arm, Patient Position: Sitting)   Pulse 76   Temp (!) 97.2 F (36.2 C) (Temporal)   Ht 6' (1.829 m)   Wt 219 lb (99.3 kg)   SpO2 97%   BMI 29.70 kg/m   GEN: Well nourished, well developed, in no acute distress  HEENT - left TM and canal normal-- right TM obscured by cerumen--- nurse did clear with water irrigation Cardiac: RRR; no murmurs, rubs, or gallops,no edema -  Respiratory:  normal respiratory rate and pattern with no distress - normal breath sounds with no rales, rhonchi, wheezes or rubs MS: no deformity or atrophy  Skin: warm and dry, no rash  Neuro:  Alert and Oriented x 3, - CN II-Xii grossly intact Psych: euthymic mood, appropriate affect and demeanor  Lab Results  Component Value Date   WBC 7.2 02/28/2022   HGB 13.9 02/28/2022   HCT 41.0 02/28/2022   PLT 266 02/28/2022   GLUCOSE 98 02/28/2022   CHOL 184 02/28/2022   TRIG 129  02/28/2022   HDL 35 (L) 02/28/2022   LDLCALC 126 (H) 02/28/2022   ALT 34 02/28/2022   AST 29 02/28/2022   NA 143 02/28/2022   K 4.1 02/28/2022   CL 99 02/28/2022   CREATININE 1.17 02/28/2022   BUN 17 02/28/2022   CO2 29 02/28/2022   TSH 13.200 (H) 02/28/2022      Assessment & Plan:   Problem List Items Addressed This Visit       Cardiovascular and Mediastinum   Essential hypertension,  benign - Primary   Relevant Orders   CBC with Differential/Platelet   Comprehensive metabolic panel   Lipid panel    Continue current meds     Digestive   GERD without esophagitis   Relevant Medications   Take meds as directed Pt will reschedule appt with Dr Chales Abrahams           Endocrine   Other specified hypothyroidism   Relevant Orders   TSH Continue synthroid     Musculoskeletal and Integument   Osteopenia   Relevant Orders   VITAMIN D 25 Hydroxy (Vit-D Deficiency, Fractures) Pt defers dexa Recommend multivitamin     Other   Right cerumen impaction Cleared with irrigation                          .  No orders of the defined types were placed in this encounter.   Orders Placed This Encounter  Procedures   CBC with Differential/Platelet   Comprehensive metabolic panel   TSH   Lipid panel   VITAMIN D 25 Hydroxy (Vit-D Deficiency, Fractures)   PSA     Follow-up: Return in about 6 months (around 03/10/2023) for chronic fasting follow-up.  An After Visit Summary was printed and given to the patient.  Jettie Pagan [facility] [phone]

## 2022-09-08 ENCOUNTER — Other Ambulatory Visit: Payer: Self-pay

## 2022-09-08 ENCOUNTER — Other Ambulatory Visit: Payer: Self-pay | Admitting: Physician Assistant

## 2022-09-08 DIAGNOSIS — E038 Other specified hypothyroidism: Secondary | ICD-10-CM

## 2022-09-08 DIAGNOSIS — E782 Mixed hyperlipidemia: Secondary | ICD-10-CM

## 2022-09-08 LAB — LIPID PANEL
Chol/HDL Ratio: 5.7 ratio — ABNORMAL HIGH (ref 0.0–5.0)
Cholesterol, Total: 205 mg/dL — ABNORMAL HIGH (ref 100–199)
HDL: 36 mg/dL — ABNORMAL LOW (ref >39)
LDL Chol Calc (NIH): 143 mg/dL — ABNORMAL HIGH (ref 0–99)
Triglycerides: 143 mg/dL (ref 0–149)
VLDL Cholesterol Cal: 26 mg/dL (ref 5–40)

## 2022-09-08 LAB — CBC WITH DIFFERENTIAL/PLATELET
Basophils Absolute: 0.1 10*3/uL (ref 0.0–0.2)
Basos: 1 %
EOS (ABSOLUTE): 0.2 10*3/uL (ref 0.0–0.4)
Eos: 3 %
Hematocrit: 42.6 % (ref 37.5–51.0)
Hemoglobin: 14.6 g/dL (ref 13.0–17.7)
Immature Grans (Abs): 0 10*3/uL (ref 0.0–0.1)
Immature Granulocytes: 0 %
Lymphocytes Absolute: 1.8 10*3/uL (ref 0.7–3.1)
Lymphs: 23 %
MCH: 31.2 pg (ref 26.6–33.0)
MCHC: 34.3 g/dL (ref 31.5–35.7)
MCV: 91 fL (ref 79–97)
Monocytes Absolute: 0.5 10*3/uL (ref 0.1–0.9)
Monocytes: 7 %
Neutrophils Absolute: 5 10*3/uL (ref 1.4–7.0)
Neutrophils: 66 %
Platelets: 271 10*3/uL (ref 150–450)
RBC: 4.68 x10E6/uL (ref 4.14–5.80)
RDW: 12.7 % (ref 11.6–15.4)
WBC: 7.6 10*3/uL (ref 3.4–10.8)

## 2022-09-08 LAB — COMPREHENSIVE METABOLIC PANEL
ALT: 32 IU/L (ref 0–44)
AST: 24 IU/L (ref 0–40)
Albumin/Globulin Ratio: 1.6 (ref 1.2–2.2)
Albumin: 4.2 g/dL (ref 3.9–4.9)
Alkaline Phosphatase: 97 IU/L (ref 44–121)
BUN/Creatinine Ratio: 15 (ref 10–24)
BUN: 15 mg/dL (ref 8–27)
Bilirubin Total: 0.6 mg/dL (ref 0.0–1.2)
CO2: 27 mmol/L (ref 20–29)
Calcium: 9.6 mg/dL (ref 8.6–10.2)
Chloride: 99 mmol/L (ref 96–106)
Creatinine, Ser: 1.02 mg/dL (ref 0.76–1.27)
Globulin, Total: 2.6 g/dL (ref 1.5–4.5)
Glucose: 103 mg/dL — ABNORMAL HIGH (ref 70–99)
Potassium: 4.2 mmol/L (ref 3.5–5.2)
Sodium: 142 mmol/L (ref 134–144)
Total Protein: 6.8 g/dL (ref 6.0–8.5)
eGFR: 81 mL/min/{1.73_m2} (ref >59)

## 2022-09-08 LAB — CARDIOVASCULAR RISK ASSESSMENT

## 2022-09-08 LAB — TSH: TSH: 8.58 u[IU]/mL — ABNORMAL HIGH (ref 0.450–4.500)

## 2022-09-08 MED ORDER — ATORVASTATIN CALCIUM 10 MG PO TABS
10.0000 mg | ORAL_TABLET | Freq: Every day | ORAL | 1 refills | Status: DC
Start: 2022-09-08 — End: 2023-06-13

## 2022-09-13 ENCOUNTER — Other Ambulatory Visit: Payer: Self-pay | Admitting: Physician Assistant

## 2022-09-13 DIAGNOSIS — E038 Other specified hypothyroidism: Secondary | ICD-10-CM

## 2022-10-08 ENCOUNTER — Other Ambulatory Visit: Payer: Self-pay | Admitting: Physician Assistant

## 2022-10-08 DIAGNOSIS — E559 Vitamin D deficiency, unspecified: Secondary | ICD-10-CM

## 2022-10-09 ENCOUNTER — Other Ambulatory Visit (INDEPENDENT_AMBULATORY_CARE_PROVIDER_SITE_OTHER): Payer: Medicare Other

## 2022-10-09 DIAGNOSIS — E038 Other specified hypothyroidism: Secondary | ICD-10-CM | POA: Diagnosis not present

## 2022-10-10 LAB — TSH: TSH: 4.79 u[IU]/mL — ABNORMAL HIGH (ref 0.450–4.500)

## 2022-10-11 ENCOUNTER — Other Ambulatory Visit: Payer: Self-pay | Admitting: Physician Assistant

## 2022-10-11 DIAGNOSIS — E038 Other specified hypothyroidism: Secondary | ICD-10-CM

## 2022-11-01 ENCOUNTER — Ambulatory Visit: Payer: Medicare Other | Attending: Cardiology | Admitting: Cardiology

## 2022-11-15 ENCOUNTER — Other Ambulatory Visit: Payer: Self-pay | Admitting: Physician Assistant

## 2022-11-15 DIAGNOSIS — I10 Essential (primary) hypertension: Secondary | ICD-10-CM

## 2022-11-15 DIAGNOSIS — K219 Gastro-esophageal reflux disease without esophagitis: Secondary | ICD-10-CM

## 2022-11-28 ENCOUNTER — Telehealth: Payer: Self-pay

## 2022-11-28 NOTE — Telephone Encounter (Signed)
**Note De-Identified Balin Vandegrift Obfuscation** 3rd Attempt I called the pt to advise him that he either needs to proceed with his HST or to return the Itamar device back to the office unopened or he will be charged $100 but I got no answer so I left a detailed message on his VM (Ok per [facility]) asking him to call Larita Fife at [facility] at [phone].

## 2022-12-06 ENCOUNTER — Other Ambulatory Visit: Payer: Medicare Other

## 2022-12-06 DIAGNOSIS — E038 Other specified hypothyroidism: Secondary | ICD-10-CM

## 2022-12-07 ENCOUNTER — Other Ambulatory Visit: Payer: Self-pay | Admitting: Physician Assistant

## 2022-12-07 DIAGNOSIS — E038 Other specified hypothyroidism: Secondary | ICD-10-CM

## 2022-12-07 MED ORDER — LEVOTHYROXINE SODIUM 175 MCG PO TABS
175.0000 ug | ORAL_TABLET | Freq: Every day | ORAL | 3 refills | Status: AC
Start: 2022-12-07 — End: ?

## 2022-12-08 NOTE — Telephone Encounter (Signed)
**Note De-Identified Jolie Strohecker Obfuscation** Forwarding this message to Dr Bing Matter for ok to cancel the pts Itamar-HST if not done by 12/29/2022. It was ordered on 04/10/2022.

## 2022-12-09 ENCOUNTER — Other Ambulatory Visit: Payer: Self-pay | Admitting: Family Medicine

## 2022-12-09 DIAGNOSIS — E038 Other specified hypothyroidism: Secondary | ICD-10-CM

## 2022-12-29 NOTE — Telephone Encounter (Signed)
**Note De-Identified Yvette Loveless Obfuscation** Th pt has not proceeded with this Itamar-HST and he did not return the [facility] One-HST device that we provided to him. Forwarding this phone note to Dr Bing Matter as Lorain Childes that I have canceled the pt Itamar-HST order and am referring the pt to billing for unreturned Watch-PAT One-HST device.

## 2023-01-31 ENCOUNTER — Other Ambulatory Visit: Payer: Medicare Other

## 2023-01-31 DIAGNOSIS — E038 Other specified hypothyroidism: Secondary | ICD-10-CM | POA: Diagnosis not present

## 2023-02-01 ENCOUNTER — Other Ambulatory Visit: Payer: Self-pay | Admitting: Physician Assistant

## 2023-02-01 DIAGNOSIS — E038 Other specified hypothyroidism: Secondary | ICD-10-CM

## 2023-02-01 LAB — TSH: TSH: 0.095 u[IU]/mL — ABNORMAL LOW (ref 0.450–4.500)

## 2023-02-01 MED ORDER — LEVOTHYROXINE SODIUM 150 MCG PO TABS
150.0000 ug | ORAL_TABLET | Freq: Every day | ORAL | 1 refills | Status: DC
Start: 2023-02-01 — End: 2023-02-27

## 2023-02-11 ENCOUNTER — Other Ambulatory Visit: Payer: Self-pay | Admitting: Physician Assistant

## 2023-02-11 DIAGNOSIS — K219 Gastro-esophageal reflux disease without esophagitis: Secondary | ICD-10-CM

## 2023-02-11 DIAGNOSIS — I10 Essential (primary) hypertension: Secondary | ICD-10-CM

## 2023-02-24 ENCOUNTER — Other Ambulatory Visit: Payer: Self-pay | Admitting: Physician Assistant

## 2023-02-24 DIAGNOSIS — E038 Other specified hypothyroidism: Secondary | ICD-10-CM

## 2023-02-26 ENCOUNTER — Encounter: Payer: Self-pay | Admitting: Physician Assistant

## 2023-02-26 ENCOUNTER — Ambulatory Visit (INDEPENDENT_AMBULATORY_CARE_PROVIDER_SITE_OTHER): Payer: Medicare Other | Admitting: Physician Assistant

## 2023-02-26 VITALS — BP 124/68 | HR 82 | Temp 97.2°F | Ht 72.0 in | Wt 208.0 lb

## 2023-02-26 DIAGNOSIS — I10 Essential (primary) hypertension: Secondary | ICD-10-CM | POA: Diagnosis not present

## 2023-02-26 DIAGNOSIS — Z23 Encounter for immunization: Secondary | ICD-10-CM | POA: Diagnosis not present

## 2023-02-26 DIAGNOSIS — E038 Other specified hypothyroidism: Secondary | ICD-10-CM | POA: Diagnosis not present

## 2023-02-26 DIAGNOSIS — R739 Hyperglycemia, unspecified: Secondary | ICD-10-CM | POA: Diagnosis not present

## 2023-02-26 DIAGNOSIS — Z1211 Encounter for screening for malignant neoplasm of colon: Secondary | ICD-10-CM

## 2023-02-26 DIAGNOSIS — K219 Gastro-esophageal reflux disease without esophagitis: Secondary | ICD-10-CM

## 2023-02-26 DIAGNOSIS — E782 Mixed hyperlipidemia: Secondary | ICD-10-CM | POA: Diagnosis not present

## 2023-02-26 DIAGNOSIS — E559 Vitamin D deficiency, unspecified: Secondary | ICD-10-CM | POA: Diagnosis not present

## 2023-02-26 NOTE — Progress Notes (Signed)
Subjective:  Patient ID: Mathew Wagner, male    DOB: December 17, 1955  Age: 68 y.o. MRN: [id number]  Chief Complaint  Patient presents with   Medical Management of Chronic Issues    HPI  Pt presents for follow up of hypertension. The patient is tolerating the medication well without side effects. Compliance with treatment has been good; including taking medication as directed , maintains a healthy diet and regular exercise regimen , and following up as directed. Pt is currently on norvasc 10mg  qd, hctz 25mg  qd, zestril 10mg  qd Denies chest pain/dyspnea  Pt with history of GERD  - currently takes protonix 40mg  qd which works well for him Would like to schedule colonoscopy  Pt with history of thyroid disease - currently on synthroid 150 mcg qd - he is overdue for labwork  Pt with history of vit D def - is taking weekly supplement --- due for labwork  Pt with history of hyperlipidemia - he states he is taking lipitor 10mg  qd and trying to watch diet  Pt with history of chronic low back pain - had surgery on lumbar spine about 2 years ago -- would like a second opinion but does not want a referral a this time - will call back Current Outpatient Medications on File Prior to Visit  Medication Sig Dispense Refill   amLODipine (NORVASC) 10 MG tablet TAKE 1 TABLET BY MOUTH EVERY DAY INSURANCE: 3/24 90 tablet 0   aspirin EC 81 MG tablet Take 1 tablet (81 mg total) by mouth daily. Swallow whole. 90 tablet 3   atorvastatin (LIPITOR) 10 MG tablet Take 1 tablet (10 mg total) by mouth daily. 90 tablet 1   hydrochlorothiazide (HYDRODIURIL) 25 MG tablet TAKE 1 TABLET BY MOUTH EVERY DAY FOR BLOOD PRESSURE 90 tablet 0   levothyroxine (SYNTHROID) 150 MCG tablet Take 1 tablet (150 mcg total) by mouth daily. 30 tablet 1   lisinopril (ZESTRIL) 10 MG tablet TAKE 1 TABLET BY MOUTH EVERY DAY 90 tablet 0   pantoprazole (PROTONIX) 40 MG tablet TAKE 1 TABLET BY MOUTH EVERY DAY 90 tablet 0   Vitamin D,  Ergocalciferol, (DRISDOL) 1.25 MG (50000 UNIT) CAPS capsule TAKE 1 CAPSULE (50,000 UNITS TOTAL) BY MOUTH EVERY 7 (SEVEN) DAYS 5 capsule 5   No current facility-administered medications on file prior to visit.   Past Medical History:  Diagnosis Date   Allergy    GERD (gastroesophageal reflux disease)    Hypertension    Thyroid disease    Past Surgical History:  Procedure Laterality Date   BACK SURGERY     L4 and L5 possibly   COLONOSCOPY     Dr Kinnie Scales- 2013   ESOPHAGOGASTRODUODENOSCOPY     Dr Chales Abrahams- possibly 1990's   NECK SURGERY     1990's.    TONSILLECTOMY      Family History  Problem Relation Age of Onset   Diabetes Mother    Lung cancer Father    Lung cancer Sister    Colon cancer Neg Hx    Esophageal cancer Neg Hx    Rectal cancer Neg Hx    Stomach cancer Neg Hx    Social History   Socioeconomic History   Marital status: Married    Spouse name: [name]   Number of children: 3   Years of education: Not on file   Highest education level: Not on file  Occupational History   Occupation: Engineer, water  Tobacco Use   Smoking status: Former  Types: Cigarettes   Smokeless tobacco: Never   Tobacco comments:    quit about 20 years ago  Vaping Use   Vaping status: Never Used  Substance and Sexual Activity   Alcohol use: Yes    Comment: rare   Drug use: Not Currently   Sexual activity: Yes  Other Topics Concern   Not on file  Social History Narrative   Not on file   Social Determinants of Health   Financial Resource Strain: Low Risk  (02/26/2023)   Overall Financial Resource Strain (CARDIA)    Difficulty of Paying Living Expenses: Not hard at all  Food Insecurity: No Food Insecurity (02/26/2023)   Hunger Vital Sign    Worried About Running Out of Food in the Last Year: Never true    Ran Out of Food in the Last Year: Never true  Transportation Needs: No Transportation Needs (02/26/2023)   PRAPARE - Administrator, Civil Service  (Medical): No    Lack of Transportation (Non-Medical): No  Physical Activity: Sufficiently Active (02/26/2023)   Exercise Vital Sign    Days of Exercise per Week: 5 days    Minutes of Exercise per Session: 40 min  Stress: No Stress Concern Present (02/26/2023)   [employer] of Occupational Health - Occupational Stress Questionnaire    Feeling of Stress : Not at all  Social Connections: Unknown (02/26/2023)   Social Connection and Isolation Panel [NHANES]    Frequency of Communication with Friends and Family: More than three times a week    Frequency of Social Gatherings with Friends and Family: Three times a week    Attends Religious Services: Patient declined    Active Member of Clubs or Organizations: No    Attends Banker Meetings: Never    Marital Status: Married    CONSTITUTIONAL: Negative for chills, fatigue, fever, unintentional weight gain and unintentional weight loss.  E/N/T: Negative for ear pain, nasal congestion and sore throat.  CARDIOVASCULAR: Negative for chest pain, dizziness, palpitations and pedal edema.  RESPIRATORY: Negative for recent cough and dyspnea.  GASTROINTESTINAL: Negative for abdominal pain, acid reflux symptoms, constipation, diarrhea, nausea and vomiting.  MSK: see HPI INTEGUMENTARY: Negative for rash.  NEUROLOGICAL: Negative for dizziness and headaches.  PSYCHIATRIC: Negative for sleep disturbance and to question depression screen.  Negative for depression, negative for anhedonia.       Objective:  PHYSICAL EXAM:   VS: BP 124/68 (BP Location: Left Arm, Patient Position: Sitting, Cuff Size: Large)   Pulse 82   Temp (!) 97.2 F (36.2 C) (Temporal)   Ht 6' (1.829 m)   Wt 208 lb (94.3 kg)   SpO2 94%   BMI 28.21 kg/m   GEN: Well nourished, well developed, in no acute distress  Cardiac: RRR; no murmurs, rubs, or gallops,no edema - Respiratory:  normal respiratory rate and pattern with no distress - normal breath sounds with  no rales, rhonchi, wheezes or rubs MS: no deformity or atrophy - walks with slightly staggered gait Skin: warm and dry, no rash  Neuro:  Alert and Oriented x 3, - CN II-Xii grossly intact Psych: euthymic mood, appropriate affect and demeanor   Lab Results  Component Value Date   WBC 7.6 09/07/2022   HGB 14.6 09/07/2022   HCT 42.6 09/07/2022   PLT 271 09/07/2022   GLUCOSE 103 (H) 09/07/2022   CHOL 205 (H) 09/07/2022   TRIG 143 09/07/2022   HDL 36 (L) 09/07/2022   LDLCALC 143 (H)  09/07/2022   ALT 32 09/07/2022   AST 24 09/07/2022   NA 142 09/07/2022   K 4.2 09/07/2022   CL 99 09/07/2022   CREATININE 1.02 09/07/2022   BUN 15 09/07/2022   CO2 27 09/07/2022   TSH 0.095 (L) 01/31/2023      Assessment & Plan:   Problem List Items Addressed This Visit       Cardiovascular and Mediastinum   Essential hypertension, benign - Primary   Relevant Orders   CBC with Differential/Platelet   Comprehensive metabolic panel   Lipid panel    Continue current meds     Digestive   GERD without esophagitis   Relevant Medications   Take meds as directed    Need for colon cancer screening Referral to Dr Chales Abrahams        Endocrine   Other specified hypothyroidism   Relevant Orders   TSH Continue synthroid     Musculoskeletal and Integument   Osteopenia   Relevant Orders   VITAMIN D 25 Hydroxy (Vit-D Deficiency, Fractures) Pt defers dexa Recommend multivitamin     Other   Chronic low back pain Pt to call back for referral when he is ready         Need for flu shot Trivalent fluad given                 .  No orders of the defined types were placed in this encounter.   Orders Placed This Encounter  Procedures   Flu Vaccine Trivalent High Dose (Fluad)   CBC with Differential/Platelet   Comprehensive metabolic panel   TSH   Lipid panel   VITAMIN D 25 Hydroxy (Vit-D Deficiency, Fractures)   Hemoglobin A1c   Ambulatory referral to Gastroenterology      Follow-up: Return in about 6 months (around 08/27/2023) for chronic fasting follow-up.  An After Visit Summary was printed and given to the patient.  Jettie Pagan [facility] [phone]

## 2023-02-27 ENCOUNTER — Other Ambulatory Visit: Payer: Self-pay | Admitting: Physician Assistant

## 2023-02-27 DIAGNOSIS — E038 Other specified hypothyroidism: Secondary | ICD-10-CM

## 2023-02-27 LAB — VITAMIN D 25 HYDROXY (VIT D DEFICIENCY, FRACTURES): Vit D, 25-Hydroxy: 50.8 ng/mL (ref 30.0–100.0)

## 2023-02-27 LAB — CBC WITH DIFFERENTIAL/PLATELET
Basophils Absolute: 0.1 10*3/uL (ref 0.0–0.2)
Basos: 1 %
EOS (ABSOLUTE): 0.4 10*3/uL (ref 0.0–0.4)
Eos: 5 %
Hematocrit: 42.7 % (ref 37.5–51.0)
Hemoglobin: 13.5 g/dL (ref 13.0–17.7)
Immature Grans (Abs): 0 10*3/uL (ref 0.0–0.1)
Immature Granulocytes: 0 %
Lymphocytes Absolute: 2 10*3/uL (ref 0.7–3.1)
Lymphs: 26 %
MCH: 29.3 pg (ref 26.6–33.0)
MCHC: 31.6 g/dL (ref 31.5–35.7)
MCV: 93 fL (ref 79–97)
Monocytes Absolute: 0.5 10*3/uL (ref 0.1–0.9)
Monocytes: 7 %
Neutrophils Absolute: 4.8 10*3/uL (ref 1.4–7.0)
Neutrophils: 61 %
Platelets: 279 10*3/uL (ref 150–450)
RBC: 4.6 x10E6/uL (ref 4.14–5.80)
RDW: 12.8 % (ref 11.6–15.4)
WBC: 7.8 10*3/uL (ref 3.4–10.8)

## 2023-02-27 LAB — LIPID PANEL
Chol/HDL Ratio: 3.5 ratio (ref 0.0–5.0)
Cholesterol, Total: 126 mg/dL (ref 100–199)
HDL: 36 mg/dL — ABNORMAL LOW (ref >39)
LDL Chol Calc (NIH): 73 mg/dL (ref 0–99)
Triglycerides: 90 mg/dL (ref 0–149)
VLDL Cholesterol Cal: 17 mg/dL (ref 5–40)

## 2023-02-27 LAB — COMPREHENSIVE METABOLIC PANEL
ALT: 29 [IU]/L (ref 0–44)
AST: 25 [IU]/L (ref 0–40)
Albumin: 4.3 g/dL (ref 3.9–4.9)
Alkaline Phosphatase: 89 [IU]/L (ref 44–121)
BUN/Creatinine Ratio: 18 (ref 10–24)
BUN: 15 mg/dL (ref 8–27)
Bilirubin Total: 0.6 mg/dL (ref 0.0–1.2)
CO2: 27 mmol/L (ref 20–29)
Calcium: 9.5 mg/dL (ref 8.6–10.2)
Chloride: 103 mmol/L (ref 96–106)
Creatinine, Ser: 0.85 mg/dL (ref 0.76–1.27)
Globulin, Total: 2.3 g/dL (ref 1.5–4.5)
Glucose: 100 mg/dL — ABNORMAL HIGH (ref 70–99)
Potassium: 3.8 mmol/L (ref 3.5–5.2)
Sodium: 144 mmol/L (ref 134–144)
Total Protein: 6.6 g/dL (ref 6.0–8.5)
eGFR: 95 mL/min/{1.73_m2} (ref >59)

## 2023-02-27 LAB — TSH: TSH: 0.11 u[IU]/mL — ABNORMAL LOW (ref 0.450–4.500)

## 2023-02-27 LAB — HEMOGLOBIN A1C
Est. average glucose Bld gHb Est-mCnc: 128 mg/dL
Hgb A1c MFr Bld: 6.1 % — ABNORMAL HIGH (ref 4.8–5.6)

## 2023-02-27 MED ORDER — LEVOTHYROXINE SODIUM 137 MCG PO TABS: 137.0000 ug | ORAL_TABLET | Freq: Every day | ORAL | 0 refills | Status: DC

## 2023-03-07 ENCOUNTER — Ambulatory Visit: Payer: Medicare Other

## 2023-03-07 DIAGNOSIS — Z0000 Encounter for general adult medical examination without abnormal findings: Secondary | ICD-10-CM

## 2023-03-07 NOTE — Progress Notes (Signed)
Subjective:   Mathew Wagner is a 67 y.o. male who presents for Medicare Annual/Subsequent preventive examination.  Visit Complete: Virtual I connected with  Mathew Wagner on 03/07/23 by a audio enabled telemedicine application and verified that I am speaking with the correct person using two identifiers.  Patient Location: Home  Provider Location: Home Office  I discussed the limitations of evaluation and management by telemedicine. The patient expressed understanding and agreed to proceed.  Vital Signs: Because this visit was a virtual/telehealth visit, some criteria may be missing or patient reported. Any vitals not documented were not able to be obtained and vitals that have been documented are patient reported.  Cardiac Risk Factors include: advanced age (>13men, >74 women);male gender;hypertension     Objective:    There were no vitals filed for this visit. There is no height or weight on file to calculate BMI.     03/07/2023    8:42 AM 03/24/2019    9:33 AM  Advanced Directives  Does Patient Have a Medical Advance Directive? No Yes  Would patient like information on creating a medical advance directive? No - Patient declined     Current Medications (verified) Outpatient Encounter Medications as of 03/07/2023  Medication Sig   amLODipine (NORVASC) 10 MG tablet TAKE 1 TABLET BY MOUTH EVERY DAY INSURANCE: 3/24   aspirin EC 81 MG tablet Take 1 tablet (81 mg total) by mouth daily. Swallow whole.   atorvastatin (LIPITOR) 10 MG tablet Take 1 tablet (10 mg total) by mouth daily.   hydrochlorothiazide (HYDRODIURIL) 25 MG tablet TAKE 1 TABLET BY MOUTH EVERY DAY FOR BLOOD PRESSURE   levothyroxine (SYNTHROID) 137 MCG tablet Take 1 tablet (137 mcg total) by mouth daily before breakfast.   lisinopril (ZESTRIL) 10 MG tablet TAKE 1 TABLET BY MOUTH EVERY DAY   pantoprazole (PROTONIX) 40 MG tablet TAKE 1 TABLET BY MOUTH EVERY DAY   Vitamin D, Ergocalciferol, (DRISDOL) 1.25 MG  (50000 UNIT) CAPS capsule TAKE 1 CAPSULE (50,000 UNITS TOTAL) BY MOUTH EVERY 7 (SEVEN) DAYS   No facility-administered encounter medications on file as of 03/07/2023.    Allergies (verified) Nasal spray   History: Past Medical History:  Diagnosis Date   Allergy    GERD (gastroesophageal reflux disease)    Hypertension    Thyroid disease    Past Surgical History:  Procedure Laterality Date   BACK SURGERY     L4 and L5 possibly   COLONOSCOPY     Dr Kinnie Scales- 2013   ESOPHAGOGASTRODUODENOSCOPY     Dr Chales Abrahams- possibly 1990's   NECK SURGERY     1990's.    TONSILLECTOMY     Family History  Problem Relation Age of Onset   Diabetes Mother    Lung cancer Father    Lung cancer Sister    Colon cancer Neg Hx    Esophageal cancer Neg Hx    Rectal cancer Neg Hx    Stomach cancer Neg Hx    Social History   Socioeconomic History   Marital status: Married    Spouse name: Mathew Wagner   Number of children: 3   Years of education: Not on file   Highest education level: Not on file  Occupational History   Occupation: Engineer, water  Tobacco Use   Smoking status: Former    Types: Cigarettes   Smokeless tobacco: Never   Tobacco comments:    quit about 20 years ago  Vaping Use   Vaping status: Never Used  Substance and Sexual Activity   Alcohol use: Yes    Comment: rare   Drug use: Not Currently   Sexual activity: Yes  Other Topics Concern   Not on file  Social History Narrative   Not on file   Social Determinants of Health   Financial Resource Strain: Low Risk  (03/07/2023)   Overall Financial Resource Strain (CARDIA)    Difficulty of Paying Living Expenses: Not hard at all  Food Insecurity: No Food Insecurity (03/07/2023)   Hunger Vital Sign    Worried About Running Out of Food in the Last Year: Never true    Ran Out of Food in the Last Year: Never true  Transportation Needs: No Transportation Needs (03/07/2023)   PRAPARE - Administrator, Civil Service  (Medical): No    Lack of Transportation (Non-Medical): No  Physical Activity: Inactive (03/07/2023)   Exercise Vital Sign    Days of Exercise per Week: 0 days    Minutes of Exercise per Session: 0 min  Stress: No Stress Concern Present (03/07/2023)   [employer] of Occupational Health - Occupational Stress Questionnaire    Feeling of Stress : Not at all  Social Connections: Moderately Isolated (03/07/2023)   Social Connection and Isolation Panel [NHANES]    Frequency of Communication with Friends and Family: More than three times a week    Frequency of Social Gatherings with Friends and Family: Once a week    Attends Religious Services: Never    Database administrator or Organizations: No    Attends Engineer, structural: Never    Marital Status: Married    Tobacco Counseling Counseling given: Not Answered Tobacco comments: quit about 20 years ago   Clinical Intake:  Pre-visit preparation completed: Yes  Pain : No/denies pain     Diabetes: No  How often do you need to have someone help you when you read instructions, pamphlets, or other written materials from your doctor or pharmacy?: 1 - Never  Interpreter Needed?: No  Information entered by :: [name] LPN   Activities of Daily Living    03/07/2023    8:44 AM 03/16/2022    9:27 AM  In your present state of health, do you have any difficulty performing the following activities:  Hearing? 0 0  Vision? 0 0  Difficulty concentrating or making decisions? 0 0  Walking or climbing stairs? 0 0  Dressing or bathing? 0 0  Doing errands, shopping? 0 0  Preparing Food and eating ? N N  Using the Toilet? N N  In the past six months, have you accidently leaked urine? N N  Do you have problems with loss of bowel control? N N  Managing your Medications? N N  Managing your Finances? N N  Housekeeping or managing your Housekeeping? N N    Patient Care Team: Mathew Wagner, Cordelia Poche as PCP - General (Physician  Assistant) Lynann Bologna, MD as Consulting Physician (Gastroenterology)  Indicate any recent Medical Services you may have received from other than [facility] providers in the past year (date may be approximate).     Assessment:   This is a routine wellness examination for [place].  Hearing/Vision screen Hearing Screening - Comments:: No trouble hearing Vision Screening - Comments:: Not up to date   Goals Addressed             This Visit's Progress    Patient Stated       Get house organized and  little things fixed       Depression Screen    03/07/2023    8:46 AM 02/26/2023    8:46 AM 09/07/2022    8:36 AM 03/16/2022    9:25 AM 08/25/2021    9:12 AM 04/01/2020    9:00 AM  PHQ 2/9 Scores  PHQ - 2 Score 0 0 2 0 0 0  PHQ- 9 Score 4  5       Fall Risk    03/07/2023    8:41 AM 02/26/2023    8:46 AM 09/07/2022    8:35 AM 03/16/2022    9:27 AM 08/25/2021    9:12 AM  Fall Risk   Falls in the past year? 1 1 1 1  0  Number falls in past yr: 1 1 0 0 0  Injury with Fall? 0 0 0 0 0  Risk for fall due to :  No Fall Risks No Fall Risks No Fall Risks   Follow up Falls evaluation completed;Education provided;Falls prevention discussed Falls evaluation completed Falls evaluation completed Falls evaluation completed;Education provided     MEDICARE RISK AT HOME: Medicare Risk at Home Any stairs in or around the home?: Yes If so, are there any without handrails?: No Home free of loose throw rugs in walkways, pet beds, electrical cords, etc?: Yes Adequate lighting in your home to reduce risk of falls?: Yes Life alert?: No Use of a cane, walker or w/c?: No Grab bars in the bathroom?: No Shower chair or bench in shower?: No Elevated toilet seat or a handicapped toilet?: No  TIMED UP AND GO:  Was the test performed?  No    Cognitive Function:        03/07/2023    8:47 AM 03/16/2022    9:28 AM  6CIT Screen  What Year? 0 points 0 points  What month? 0 points 0 points  What time?  0 points 0 points  Count back from 20 0 points 0 points  Months in reverse 0 points 0 points  Repeat phrase 0 points 0 points  Total Score 0 points 0 points    Immunizations Immunization History  Administered Date(s) Administered   Fluad Quad(high Dose 65+) 04/01/2020, 02/28/2022   Fluad Trivalent(High Dose 65+) 02/26/2023   Influenza Inj Mdck Quad Pf 07/02/2019   Influenza-Unspecified 03/25/2018   PNEUMOCOCCAL CONJUGATE-20 12/05/2021   Pneumococcal Conjugate-13 03/20/2018, 04/01/2020   Pneumococcal Polysaccharide-23 08/25/2021    TDAP status: Due, Education has been provided regarding the importance of this vaccine. Advised may receive this vaccine at local pharmacy or Health Dept. Aware to provide a copy of the vaccination record if obtained from local pharmacy or Health Dept. Verbalized acceptance and understanding.  Flu Vaccine status: Up to date  Pneumococcal vaccine status: Up to date  Covid-19 vaccine status: Information provided on how to obtain vaccines.   Qualifies for Shingles Vaccine? Yes   Zostavax completed No   Shingrix Completed?: No.    Education has been provided regarding the importance of this vaccine. Patient has been advised to call insurance company to determine out of pocket expense if they have not yet received this vaccine. Advised may also receive vaccine at local pharmacy or Health Dept. Verbalized acceptance and understanding.  Screening Tests Health Maintenance  Topic Date Due   Colonoscopy  04/06/2023 (Originally 02/14/2000)   Zoster Vaccines- Shingrix (1 of 2) 05/29/2023 (Originally 02/13/2005)   DTaP/Tdap/Td (1 - Tdap) 02/26/2024 (Originally 02/13/1974)   DEXA SCAN  03/06/2024 (Originally 02/07/2022)  Medicare Annual Wellness (AWV)  03/06/2024   Pneumonia Vaccine 35+ Years old  Completed   INFLUENZA VACCINE  Completed   Hepatitis C Screening  Completed   HPV VACCINES  Aged Out   COVID-19 Vaccine  Discontinued    Health  Maintenance  There are no preventive care reminders to display for this patient.   Colonoscopy   scheduled for January  Lung Cancer Screening: (Low Dose CT Chest recommended if Age 65-80 years, 20 pack-year currently smoking OR have quit w/in 15years.) does not qualify.   Lung Cancer Screening Referral:   Additional Screening:  Hepatitis C Screening: does not qualify; Completed 2013  Vision Screening: Recommended annual ophthalmology exams for early detection of glaucoma and other disorders of the eye. Is the patient up to date with their annual eye exam?  No  Who is the provider or what is the name of the office in which the patient attends annual eye exams? Finding a new physician  If pt is not established with a provider, would they like to be referred to a provider to establish care? No .   Dental Screening: Recommended annual dental exams for proper oral hygiene    Community Resource Referral / Chronic Care Management: CRR required this visit?  No   CCM required this visit?  No     Plan:     I have personally reviewed and noted the following in the patient's chart:   Medical and social history Use of alcohol, tobacco or illicit drugs  Current medications and supplements including opioid prescriptions. Patient is not currently taking opioid prescriptions. Functional ability and status Nutritional status Physical activity Advanced directives List of other physicians Hospitalizations, surgeries, and ER visits in previous 12 months Vitals Screenings to include cognitive, depression, and falls Referrals and appointments  In addition, I have reviewed and discussed with patient certain preventive protocols, quality metrics, and best practice recommendations. A written personalized care plan for preventive services as well as general preventive health recommendations were provided to patient.     [name], LPN   16/05/958   After Visit Summary: (MyChart) Due to  this being a telephonic visit, the after visit summary with patients personalized plan was offered to patient via MyChart   Nurse Notes:

## 2023-03-07 NOTE — Patient Instructions (Signed)
[name] , Thank you for taking time to come for your Medicare Wellness Visit. I appreciate your ongoing commitment to your health goals. Please review the following plan we discussed and let me know if I can assist you in the future.   Screening recommendations/referrals: Colonoscopy: scheduled Recommended yearly ophthalmology/optometry visit for glaucoma screening and checkup Recommended yearly dental visit for hygiene and checkup  Vaccinations: Influenza vaccine: up to date Pneumococcal vaccine: up to date Tdap vaccine: Education provided Shingles vaccine: Education provided    Advanced directives: Education provided   Preventive Care 65 Years and Older, Male Preventive care refers to lifestyle choices and visits with your health care provider that can promote health and wellness. What does preventive care include? A yearly physical exam. This is also called an annual well check. Dental exams once or twice a year. Routine eye exams. Ask your health care provider how often you should have your eyes checked. Personal lifestyle choices, including: Daily care of your teeth and gums. Regular physical activity. Eating a healthy diet. Avoiding tobacco and drug use. Limiting alcohol use. Practicing safe sex. Taking low doses of aspirin every day. Taking vitamin and mineral supplements as recommended by your health care provider. What happens during an annual well check? The services and screenings done by your health care provider during your annual well check will depend on your age, overall health, lifestyle risk factors, and family history of disease. Counseling  Your health care provider may ask you questions about your: Alcohol use. Tobacco use. Drug use. Emotional well-being. Home and relationship well-being. Sexual activity. Eating habits. History of falls. Memory and ability to understand (cognition). Work and work Astronomer. Screening  You may have the  following tests or measurements: Height, weight, and BMI. Blood pressure. Lipid and cholesterol levels. These may be checked every 5 years, or more frequently if you are over 31 years old. Skin check. Lung cancer screening. You may have this screening every year starting at age 41 if you have a 30-pack-year history of smoking and currently smoke or have quit within the past 15 years. Fecal occult blood test (FOBT) of the stool. You may have this test every year starting at age 3. Flexible sigmoidoscopy or colonoscopy. You may have a sigmoidoscopy every 5 years or a colonoscopy every 10 years starting at age 49. Prostate cancer screening. Recommendations will vary depending on your family history and other risks. Hepatitis C blood test. Hepatitis B blood test. Sexually transmitted disease (STD) testing. Diabetes screening. This is done by checking your blood sugar (glucose) after you have not eaten for a while (fasting). You may have this done every 1-3 years. Abdominal aortic aneurysm (AAA) screening. You may need this if you are a current or former smoker. Osteoporosis. You may be screened starting at age 38 if you are at high risk. Talk with your health care provider about your test results, treatment options, and if necessary, the need for more tests. Vaccines  Your health care provider may recommend certain vaccines, such as: Influenza vaccine. This is recommended every year. Tetanus, diphtheria, and acellular pertussis (Tdap, Td) vaccine. You may need a Td booster every 10 years. Zoster vaccine. You may need this after age 59. Pneumococcal 13-valent conjugate (PCV13) vaccine. One dose is recommended after age 28. Pneumococcal polysaccharide (PPSV23) vaccine. One dose is recommended after age 41. Talk to your health care provider about which screenings and vaccines you need and how often you need them. This information is not intended  to replace advice given to you by your health care  provider. Make sure you discuss any questions you have with your health care provider. Document Released: 05/14/2015 Document Revised: 01/05/2016 Document Reviewed: 02/16/2015 Elsevier Interactive Patient Education  2017 [employer].  Fall Prevention in the Home Falls can cause injuries. They can happen to people of all ages. There are many things you can do to make your home safe and to help prevent falls. What can I do on the outside of my home? Regularly fix the edges of walkways and driveways and fix any cracks. Remove anything that might make you trip as you walk through a door, such as a raised step or threshold. Trim any bushes or trees on the path to your home. Use bright outdoor lighting. Clear any walking paths of anything that might make someone trip, such as rocks or tools. Regularly check to see if handrails are loose or broken. Make sure that both sides of any steps have handrails. Any raised decks and porches should have guardrails on the edges. Have any leaves, snow, or ice cleared regularly. Use sand or salt on walking paths during winter. Clean up any spills in your garage right away. This includes oil or grease spills. What can I do in the bathroom? Use night lights. Install grab bars by the toilet and in the tub and shower. Do not use towel bars as grab bars. Use non-skid mats or decals in the tub or shower. If you need to sit down in the shower, use a plastic, non-slip stool. Keep the floor dry. Clean up any water that spills on the floor as soon as it happens. Remove soap buildup in the tub or shower regularly. Attach bath mats securely with double-sided non-slip rug tape. Do not have throw rugs and other things on the floor that can make you trip. What can I do in the bedroom? Use night lights. Make sure that you have a light by your bed that is easy to reach. Do not use any sheets or blankets that are too big for your bed. They should not hang down onto the  floor. Have a firm chair that has side arms. You can use this for support while you get dressed. Do not have throw rugs and other things on the floor that can make you trip. What can I do in the kitchen? Clean up any spills right away. Avoid walking on wet floors. Keep items that you use a lot in easy-to-reach places. If you need to reach something above you, use a strong step stool that has a grab bar. Keep electrical cords out of the way. Do not use floor polish or wax that makes floors slippery. If you must use wax, use non-skid floor wax. Do not have throw rugs and other things on the floor that can make you trip. What can I do with my stairs? Do not leave any items on the stairs. Make sure that there are handrails on both sides of the stairs and use them. Fix handrails that are broken or loose. Make sure that handrails are as long as the stairways. Check any carpeting to make sure that it is firmly attached to the stairs. Fix any carpet that is loose or worn. Avoid having throw rugs at the top or bottom of the stairs. If you do have throw rugs, attach them to the floor with carpet tape. Make sure that you have a light switch at the top of the stairs and  the bottom of the stairs. If you do not have them, ask someone to add them for you. What else can I do to help prevent falls? Wear shoes that: Do not have high heels. Have rubber bottoms. Are comfortable and fit you well. Are closed at the toe. Do not wear sandals. If you use a stepladder: Make sure that it is fully opened. Do not climb a closed stepladder. Make sure that both sides of the stepladder are locked into place. Ask someone to hold it for you, if possible. Clearly mark and make sure that you can see: Any grab bars or handrails. First and last steps. Where the edge of each step is. Use tools that help you move around (mobility aids) if they are needed. These include: Canes. Walkers. Scooters. Crutches. Turn on the  lights when you go into a dark area. Replace any light bulbs as soon as they burn out. Set up your furniture so you have a clear path. Avoid moving your furniture around. If any of your floors are uneven, fix them. If there are any pets around you, be aware of where they are. Review your medicines with your doctor. Some medicines can make you feel dizzy. This can increase your chance of falling. Ask your doctor what other things that you can do to help prevent falls. This information is not intended to replace advice given to you by your health care provider. Make sure you discuss any questions you have with your health care provider. Document Released: 02/11/2009 Document Revised: 09/23/2015 Document Reviewed: 05/22/2014 Elsevier Interactive Patient Education  2017 [employer].

## 2023-03-15 ENCOUNTER — Ambulatory Visit: Payer: Medicare Other | Admitting: Physician Assistant

## 2023-04-02 ENCOUNTER — Other Ambulatory Visit: Payer: Medicare Other

## 2023-04-27 ENCOUNTER — Other Ambulatory Visit: Payer: Medicare Other

## 2023-04-27 DIAGNOSIS — E038 Other specified hypothyroidism: Secondary | ICD-10-CM

## 2023-04-28 LAB — TSH: TSH: 0.781 u[IU]/mL (ref 0.450–4.500)

## 2023-05-01 ENCOUNTER — Ambulatory Visit (AMBULATORY_SURGERY_CENTER): Payer: Medicare Other | Admitting: *Deleted

## 2023-05-01 ENCOUNTER — Telehealth: Payer: Self-pay | Admitting: *Deleted

## 2023-05-01 VITALS — Ht 72.0 in | Wt 205.0 lb

## 2023-05-01 DIAGNOSIS — Z1211 Encounter for screening for malignant neoplasm of colon: Secondary | ICD-10-CM

## 2023-05-01 MED ORDER — NA SULFATE-K SULFATE-MG SULF 17.5-3.13-1.6 GM/177ML PO SOLN: 1.0000 | Freq: Once | ORAL | 0 refills | Status: AC

## 2023-05-01 NOTE — Progress Notes (Signed)
 Pt's name and DOB verified at the beginning of the pre-visit wit 2 identifiers  Pt denies any difficulty with ambulating,sitting, laying down or rolling side to side  Pt has no issues with ambulation   Pt has no issues moving head neck or swallowing  No egg or soy allergy known to patient   No issues known to pt with past sedation with any surgeries or procedures  Patient denies ever being intubated  No FH of Malignant Hyperthermia  Pt is not on diet pills or shots  Pt is not on home 02   Pt is not on blood thinners   Pt denies issues with constipation   Pt is not on dialysis  Pt denise any abnormal heart rhythms   Pt denies any upcoming cardiac testing  Pt encouraged to use to use Singlecare or Goodrx to reduce cost   Patient's chart reviewed by Norleen Schillings CNRA prior to pre-visit and patient appropriate for the [facility].  Pre-visit completed and red dot placed by patient's name on their procedure day (on provider's schedule).  .  Visit by phone  Pt states weight is 205 lb  Instructed pt why it is important to and  to call if they have any changes in health or new medications. Directed them to the # given and on instructions.     Instructions reviewed. Pt given both [facility] main # and MD on call # prior to instructions.  Pt states understanding. Instructed to review again prior to procedure. Pt states they will.   Instructions sent by mail with coupon and by My Chart  Coupon sent via text to mobile phone and pt verified they received it

## 2023-05-01 NOTE — Telephone Encounter (Signed)
Attempt to reach pt for pre-visit. Unable to LM  Will attempt to reach again in 5 min due to no other # listed in profile  2nd attempted reached # and person stated wrong # 3rd attempt with # in profile no answer only BS

## 2023-05-03 ENCOUNTER — Other Ambulatory Visit: Payer: Self-pay

## 2023-05-03 DIAGNOSIS — E038 Other specified hypothyroidism: Secondary | ICD-10-CM

## 2023-05-03 MED ORDER — LEVOTHYROXINE SODIUM 137 MCG PO TABS: 137.0000 ug | ORAL_TABLET | Freq: Every day | ORAL | 0 refills | Status: DC

## 2023-05-16 ENCOUNTER — Encounter: Payer: Medicare Other | Admitting: Gastroenterology

## 2023-05-25 ENCOUNTER — Other Ambulatory Visit: Payer: Self-pay | Admitting: Physician Assistant

## 2023-05-25 DIAGNOSIS — E038 Other specified hypothyroidism: Secondary | ICD-10-CM

## 2023-05-30 ENCOUNTER — Other Ambulatory Visit: Payer: Self-pay | Admitting: Physician Assistant

## 2023-05-30 DIAGNOSIS — K219 Gastro-esophageal reflux disease without esophagitis: Secondary | ICD-10-CM

## 2023-05-30 DIAGNOSIS — I10 Essential (primary) hypertension: Secondary | ICD-10-CM

## 2023-06-13 ENCOUNTER — Other Ambulatory Visit: Payer: Self-pay | Admitting: Physician Assistant

## 2023-06-13 DIAGNOSIS — E782 Mixed hyperlipidemia: Secondary | ICD-10-CM

## 2023-07-06 ENCOUNTER — Other Ambulatory Visit: Payer: Self-pay | Admitting: Physician Assistant

## 2023-07-06 DIAGNOSIS — E559 Vitamin D deficiency, unspecified: Secondary | ICD-10-CM

## 2023-08-03 ENCOUNTER — Other Ambulatory Visit: Payer: Self-pay | Admitting: Physician Assistant

## 2023-08-03 DIAGNOSIS — E038 Other specified hypothyroidism: Secondary | ICD-10-CM

## 2023-08-25 ENCOUNTER — Other Ambulatory Visit: Payer: Self-pay | Admitting: Physician Assistant

## 2023-08-25 DIAGNOSIS — I10 Essential (primary) hypertension: Secondary | ICD-10-CM

## 2023-08-25 DIAGNOSIS — K219 Gastro-esophageal reflux disease without esophagitis: Secondary | ICD-10-CM

## 2023-08-31 ENCOUNTER — Ambulatory Visit (INDEPENDENT_AMBULATORY_CARE_PROVIDER_SITE_OTHER): Payer: Medicare Other | Admitting: Physician Assistant

## 2023-08-31 ENCOUNTER — Encounter: Payer: Self-pay | Admitting: Physician Assistant

## 2023-08-31 ENCOUNTER — Other Ambulatory Visit: Payer: Self-pay | Admitting: Physician Assistant

## 2023-08-31 VITALS — BP 136/82 | HR 77 | Temp 97.8°F | Resp 18 | Ht 72.0 in | Wt 204.6 lb

## 2023-08-31 DIAGNOSIS — E782 Mixed hyperlipidemia: Secondary | ICD-10-CM

## 2023-08-31 DIAGNOSIS — M545 Low back pain, unspecified: Secondary | ICD-10-CM

## 2023-08-31 DIAGNOSIS — K219 Gastro-esophageal reflux disease without esophagitis: Secondary | ICD-10-CM | POA: Diagnosis not present

## 2023-08-31 DIAGNOSIS — E559 Vitamin D deficiency, unspecified: Secondary | ICD-10-CM

## 2023-08-31 DIAGNOSIS — Z1211 Encounter for screening for malignant neoplasm of colon: Secondary | ICD-10-CM

## 2023-08-31 DIAGNOSIS — E038 Other specified hypothyroidism: Secondary | ICD-10-CM | POA: Diagnosis not present

## 2023-08-31 DIAGNOSIS — G8929 Other chronic pain: Secondary | ICD-10-CM

## 2023-08-31 DIAGNOSIS — I10 Essential (primary) hypertension: Secondary | ICD-10-CM | POA: Diagnosis not present

## 2023-08-31 DIAGNOSIS — Z125 Encounter for screening for malignant neoplasm of prostate: Secondary | ICD-10-CM

## 2023-08-31 DIAGNOSIS — R7303 Prediabetes: Secondary | ICD-10-CM | POA: Diagnosis not present

## 2023-08-31 MED ORDER — TIZANIDINE HCL 2 MG PO CAPS: 2.0000 mg | ORAL_CAPSULE | Freq: Every evening | ORAL | 1 refills | Status: DC | PRN

## 2023-08-31 MED ORDER — TIZANIDINE HCL 2 MG PO TABS
2.0000 mg | ORAL_TABLET | Freq: Every evening | ORAL | 1 refills | Status: DC | PRN
Start: 2023-08-31 — End: 2023-09-24

## 2023-08-31 NOTE — Progress Notes (Signed)
 Subjective:  Patient ID: Mathew Wagner, male    DOB: 04/29/1956  Age: 68 y.o. MRN: [id number]  Chief Complaint  Patient presents with   Medical Management of Chronic Issues    HPI  Pt presents for follow up of hypertension. The patient is tolerating the medication well without side effects. Compliance with treatment has been good; including taking medication as directed , maintains a healthy diet and regular exercise regimen , and following up as directed. Pt is currently on norvasc  10mg  qd, hctz 25mg  qd, zestril  10mg  qd Denies chest pain/dyspnea  Pt with history of GERD  but only on occasion - he stopped protonix  because he was not tolerating medication Would like to schedule colonoscopy  Pt with history of thyroid  disease - currently on synthroid  137 mcg qd - due for labwork  Pt with history of vit D def - is taking weekly supplement --- due for labwork  Pt with history of hyperlipidemia - he states he is taking lipitor 10mg  qd and trying to watch diet  Pt with history of chronic low back pain - had surgery on lumbar spine about 2 years ago --he is having trouble getting comfortable at night - had been on flexeril  in past and would like to try a different muscle relaxer Current Outpatient Medications on File Prior to Visit  Medication Sig Dispense Refill   amLODipine  (NORVASC ) 10 MG tablet TAKE 1 TABLET BY MOUTH EVERY DAY INSURANCE: 3/24 90 tablet 0   aspirin  EC 81 MG tablet Take 1 tablet (81 mg total) by mouth daily. Swallow whole. 90 tablet 3   hydrochlorothiazide  (HYDRODIURIL ) 25 MG tablet TAKE 1 TABLET BY MOUTH EVERY DAY FOR BLOOD PRESSURE 90 tablet 0   levothyroxine  (SYNTHROID ) 137 MCG tablet TAKE 1 TABLET BY MOUTH DAILY BEFORE BREAKFAST. 90 tablet 0   lisinopril  (ZESTRIL ) 10 MG tablet TAKE 1 TABLET BY MOUTH EVERY DAY 90 tablet 0   pantoprazole  (PROTONIX ) 40 MG tablet TAKE 1 TABLET BY MOUTH EVERY DAY 90 tablet 0   Vitamin D , Ergocalciferol , (DRISDOL ) 1.25 MG (50000 UNIT)  CAPS capsule TAKE 1 CAPSULE (50,000 UNITS TOTAL) BY MOUTH EVERY 7 (SEVEN) DAYS 5 capsule 5   No current facility-administered medications on file prior to visit.   Past Medical History:  Diagnosis Date   Allergy    Chronic kidney disease    Stones   GERD (gastroesophageal reflux disease)    Hiatal hernia    Hyperlipidemia    Hypertension    Substance abuse (HCC)    Thyroid  disease    Past Surgical History:  Procedure Laterality Date   BACK SURGERY     L4 and L5 possibly   COLONOSCOPY     Dr Andriette Keeling- 2013   ESOPHAGOGASTRODUODENOSCOPY     Dr Venice Gillis- possibly 1990's   HEPARIN ASSOCIATED ANTIBODY DETECTION (CONVERTED LAB)     NECK SURGERY     1990's.    TONSILLECTOMY      Family History  Problem Relation Age of Onset   Diabetes Mother    Lung cancer Father    Lung cancer Sister    Stomach cancer Brother    Colon cancer Neg Hx    Esophageal cancer Neg Hx    Rectal cancer Neg Hx    Colon polyps Neg Hx    Social History   Socioeconomic History   Marital status: Married    Spouse name: Abe Abed   Number of children: 3   Years of education: Not on file  Highest education level: Not on file  Occupational History   Occupation: Engineer, water  Tobacco Use   Smoking status: Former    Types: Cigarettes   Smokeless tobacco: Never   Tobacco comments:    quit about 20 years ago  Vaping Use   Vaping status: Never Used  Substance and Sexual Activity   Alcohol use: Yes    Comment: rare   Drug use: Not Currently   Sexual activity: Yes  Other Topics Concern   Not on file  Social History Narrative   Not on file   Social Drivers of Health   Financial Resource Strain: Low Risk  (03/07/2023)   Overall Financial Resource Strain (CARDIA)    Difficulty of Paying Living Expenses: Not hard at all  Food Insecurity: No Food Insecurity (03/07/2023)   Hunger Vital Sign    Worried About Running Out of Food in the Last Year: Never true    Ran Out of Food in the Last Year:  Never true  Transportation Needs: No Transportation Needs (03/07/2023)   PRAPARE - Administrator, Civil Service (Medical): No    Lack of Transportation (Non-Medical): No  Physical Activity: Inactive (03/07/2023)   Exercise Vital Sign    Days of Exercise per Week: 0 days    Minutes of Exercise per Session: 0 min  Stress: No Stress Concern Present (03/07/2023)   [employer] of Occupational Health - Occupational Stress Questionnaire    Feeling of Stress : Not at all  Social Connections: Moderately Isolated (03/07/2023)   Social Connection and Isolation Panel [NHANES]    Frequency of Communication with Friends and Family: More than three times a week    Frequency of Social Gatherings with Friends and Family: Once a week    Attends Religious Services: Never    Database administrator or Organizations: No    Attends Engineer, structural: Never    Marital Status: Married    CONSTITUTIONAL: Negative for chills, fatigue, fever, unintentional weight gain and unintentional weight loss.  E/N/T: Negative for ear pain, nasal congestion and sore throat.  CARDIOVASCULAR: Negative for chest pain, dizziness, palpitations and pedal edema.  RESPIRATORY: Negative for recent cough and dyspnea.  GASTROINTESTINAL: Negative for abdominal pain, acid reflux symptoms, constipation, diarrhea, nausea and vomiting.  MSK: see HPI INTEGUMENTARY: Negative for rash.  NEUROLOGICAL: Negative for dizziness and headaches.  PSYCHIATRIC: Negative for sleep disturbance and to question depression screen.  Negative for depression, negative for anhedonia.      Objective:  PHYSICAL EXAM:   VS: BP 136/82   Pulse 77   Temp 97.8 F (36.6 C) (Temporal)   Resp 18   Ht 6' (1.829 m)   Wt 204 lb 9.6 oz (92.8 kg)   SpO2 95%   BMI 27.75 kg/m   GEN: Well nourished, well developed, in no acute distress   Cardiac: RRR; no murmurs,  Respiratory:  normal respiratory rate and pattern with no distress -  normal breath sounds with no rales, rhonchi, wheezes or rubs  MS: no deformity or atrophy  Skin: warm and dry, no rash  Neuro:  Alert and Oriented x 3,- CN II-Xii grossly intact Psych: euthymic mood, appropriate affect and demeanor  Lab Results  Component Value Date   WBC 7.8 02/26/2023   HGB 13.5 02/26/2023   HCT 42.7 02/26/2023   PLT 279 02/26/2023   GLUCOSE 100 (H) 02/26/2023   CHOL 126 02/26/2023   TRIG 90 02/26/2023   HDL  36 (L) 02/26/2023   LDLCALC 73 02/26/2023   ALT 29 02/26/2023   AST 25 02/26/2023   NA 144 02/26/2023   K 3.8 02/26/2023   CL 103 02/26/2023   CREATININE 0.85 02/26/2023   BUN 15 02/26/2023   CO2 27 02/26/2023   TSH 0.781 04/27/2023   HGBA1C 6.1 (H) 02/26/2023      Assessment & Plan:   Problem List Items Addressed This Visit       Cardiovascular and Mediastinum   Essential hypertension, benign - Primary   Relevant Orders   CBC with Differential/Platelet   Comprehensive metabolic panel   Lipid panel    Continue current meds     Digestive   GERD without esophagitis   Relevant Medications   Use TUMS as needed    Need for colon cancer screening Referral to Dr Venice Gillis        Endocrine   Other specified hypothyroidism   Relevant Orders   TSH Continue synthroid      Musculoskeletal and Integument   Osteopenia   Relevant Orders   VITAMIN D  25 Hydroxy (Vit-D Deficiency, Fractures) Pt defers dexa Recommend multivitamin     Other   Chronic low back pain Rx tizanadine                          .  Meds ordered this encounter  Medications   tizanidine (ZANAFLEX) 2 MG capsule    Sig: Take 1 capsule (2 mg total) by mouth at bedtime as needed for muscle spasms.    Dispense:  30 capsule    Refill:  1    Supervising Provider:   Mercy Stall [phone]    Orders Placed This Encounter  Procedures   CBC with Differential/Platelet   Comprehensive metabolic panel with GFR   TSH   Lipid panel   Hemoglobin A1c   PSA   VITAMIN D   25 Hydroxy (Vit-D Deficiency, Fractures)   Ambulatory referral to Gastroenterology     Follow-up: Return in about 6 months (around 03/02/2024) for chronic fasting follow-up.  An After Visit Summary was printed and given to the patient.  Anthonette Bastos [facility] [phone]

## 2023-09-01 LAB — CBC WITH DIFFERENTIAL/PLATELET
Basophils Absolute: 0.1 10*3/uL (ref 0.0–0.2)
Basos: 1 %
EOS (ABSOLUTE): 0.3 10*3/uL (ref 0.0–0.4)
Eos: 5 %
Hematocrit: 41.7 % (ref 37.5–51.0)
Hemoglobin: 13.7 g/dL (ref 13.0–17.7)
Immature Grans (Abs): 0 10*3/uL (ref 0.0–0.1)
Immature Granulocytes: 0 %
Lymphocytes Absolute: 2.3 10*3/uL (ref 0.7–3.1)
Lymphs: 34 %
MCH: 30.4 pg (ref 26.6–33.0)
MCHC: 32.9 g/dL (ref 31.5–35.7)
MCV: 93 fL (ref 79–97)
Monocytes Absolute: 0.5 10*3/uL (ref 0.1–0.9)
Monocytes: 7 %
Neutrophils Absolute: 3.5 10*3/uL (ref 1.4–7.0)
Neutrophils: 53 %
Platelets: 252 10*3/uL (ref 150–450)
RBC: 4.5 x10E6/uL (ref 4.14–5.80)
RDW: 12.8 % (ref 11.6–15.4)
WBC: 6.6 10*3/uL (ref 3.4–10.8)

## 2023-09-01 LAB — COMPREHENSIVE METABOLIC PANEL WITH GFR
ALT: 25 IU/L (ref 0–44)
AST: 26 IU/L (ref 0–40)
Albumin: 4.3 g/dL (ref 3.9–4.9)
Alkaline Phosphatase: 102 IU/L (ref 44–121)
BUN/Creatinine Ratio: 19 (ref 10–24)
BUN: 18 mg/dL (ref 8–27)
Bilirubin Total: 0.6 mg/dL (ref 0.0–1.2)
CO2: 26 mmol/L (ref 20–29)
Calcium: 9.6 mg/dL (ref 8.6–10.2)
Chloride: 101 mmol/L (ref 96–106)
Creatinine, Ser: 0.95 mg/dL (ref 0.76–1.27)
Globulin, Total: 2.6 g/dL (ref 1.5–4.5)
Glucose: 101 mg/dL — ABNORMAL HIGH (ref 70–99)
Potassium: 3.8 mmol/L (ref 3.5–5.2)
Sodium: 143 mmol/L (ref 134–144)
Total Protein: 6.9 g/dL (ref 6.0–8.5)
eGFR: 87 mL/min/{1.73_m2} (ref >59)

## 2023-09-01 LAB — LIPID PANEL
Chol/HDL Ratio: 5.2 ratio — ABNORMAL HIGH (ref 0.0–5.0)
Cholesterol, Total: 167 mg/dL (ref 100–199)
HDL: 32 mg/dL — ABNORMAL LOW (ref >39)
LDL Chol Calc (NIH): 115 mg/dL — ABNORMAL HIGH (ref 0–99)
Triglycerides: 111 mg/dL (ref 0–149)
VLDL Cholesterol Cal: 20 mg/dL (ref 5–40)

## 2023-09-01 LAB — PSA: Prostate Specific Ag, Serum: 0.8 ng/mL (ref 0.0–4.0)

## 2023-09-01 LAB — VITAMIN D 25 HYDROXY (VIT D DEFICIENCY, FRACTURES): Vit D, 25-Hydroxy: 50.8 ng/mL (ref 30.0–100.0)

## 2023-09-01 LAB — TSH: TSH: 0.308 u[IU]/mL — ABNORMAL LOW (ref 0.450–4.500)

## 2023-09-01 LAB — HEMOGLOBIN A1C
Est. average glucose Bld gHb Est-mCnc: 120 mg/dL
Hgb A1c MFr Bld: 5.8 % — ABNORMAL HIGH (ref 4.8–5.6)

## 2023-09-02 ENCOUNTER — Other Ambulatory Visit: Payer: Self-pay | Admitting: Physician Assistant

## 2023-09-02 DIAGNOSIS — E038 Other specified hypothyroidism: Secondary | ICD-10-CM

## 2023-09-02 MED ORDER — LEVOTHYROXINE SODIUM 125 MCG PO TABS: 125.0000 ug | ORAL_TABLET | Freq: Every day | ORAL | 3 refills | Status: DC

## 2023-09-23 ENCOUNTER — Other Ambulatory Visit: Payer: Self-pay | Admitting: Physician Assistant

## 2023-09-25 ENCOUNTER — Encounter: Payer: Self-pay | Admitting: Physician Assistant

## 2023-10-30 ENCOUNTER — Other Ambulatory Visit

## 2023-10-31 ENCOUNTER — Other Ambulatory Visit

## 2023-11-01 ENCOUNTER — Other Ambulatory Visit

## 2023-11-01 DIAGNOSIS — E038 Other specified hypothyroidism: Secondary | ICD-10-CM | POA: Diagnosis not present

## 2023-11-02 LAB — TSH: TSH: 1.02 u[IU]/mL (ref 0.450–4.500)

## 2023-11-05 ENCOUNTER — Ambulatory Visit: Payer: Self-pay | Admitting: Physician Assistant

## 2023-11-13 ENCOUNTER — Other Ambulatory Visit: Payer: Self-pay | Admitting: Physician Assistant

## 2023-11-13 DIAGNOSIS — E038 Other specified hypothyroidism: Secondary | ICD-10-CM

## 2023-11-14 ENCOUNTER — Other Ambulatory Visit: Payer: Self-pay | Admitting: Physician Assistant

## 2023-11-14 DIAGNOSIS — E038 Other specified hypothyroidism: Secondary | ICD-10-CM

## 2023-11-14 MED ORDER — LEVOTHYROXINE SODIUM 125 MCG PO TABS: 125.0000 ug | ORAL_TABLET | Freq: Every day | ORAL | 0 refills | Status: DC

## 2023-11-14 NOTE — Telephone Encounter (Signed)
 Copied from CRM [id number]. Topic: Clinical - Medication Refill >> Nov 14, 2023 12:25 PM Silvana PARAS wrote: Medication: levothyroxine  (SYNTHROID ) 125 MCG tablet  Has the patient contacted their pharmacy? Yes (Agent: If no, request that the patient contact the pharmacy for the refill. If patient does not wish to contact the pharmacy document the reason why and proceed with request.) (Agent: If yes, when and what did the pharmacy advise?)  This is the patient's preferred pharmacy:  [id number] - [place], [place] - [address] [facility] [place] [address] Phone: [phone] Fax: [phone]  Is this the correct pharmacy for this prescription? Yes If no, delete pharmacy and type the correct one.   Has the prescription been filled recently? Yes  Is the patient out of the medication? No  Has the patient been seen for an appointment in the last year OR does the patient have an upcoming appointment? Yes  Can we respond through MyChart? Yes  Agent: Please be advised that Rx refills may take up to 3 business days. We ask that you follow-up with your pharmacy.

## 2023-11-22 ENCOUNTER — Other Ambulatory Visit: Payer: Self-pay | Admitting: Physician Assistant

## 2023-11-22 DIAGNOSIS — I10 Essential (primary) hypertension: Secondary | ICD-10-CM

## 2023-11-22 DIAGNOSIS — K219 Gastro-esophageal reflux disease without esophagitis: Secondary | ICD-10-CM

## 2023-12-26 ENCOUNTER — Other Ambulatory Visit: Payer: Self-pay | Admitting: Physician Assistant

## 2024-01-11 ENCOUNTER — Other Ambulatory Visit: Payer: Self-pay | Admitting: Physician Assistant

## 2024-01-11 DIAGNOSIS — E782 Mixed hyperlipidemia: Secondary | ICD-10-CM

## 2024-01-14 NOTE — Telephone Encounter (Signed)
Called patient left message for patient to call office back. 

## 2024-01-15 ENCOUNTER — Telehealth: Payer: Self-pay

## 2024-01-15 NOTE — Telephone Encounter (Unsigned)
 Copied from CRM [id number]. Topic: Clinical - Medication Question >> Jan 15, 2024 11:45 AM Mia F wrote: Reason for CRM: Pt was returning CMA [place] call. Per notes, CMA was calling to see if pt stopped medication. Pt says if the Atorvastatin  Calcium  10 mg Oral Daily is the medication he told the doctor he no longer wanted to take then that was giving him problems and he does not want to take that but he does want to further discuss this medication and some others with nurse. Called CAL but no answer. Please call pt back

## 2024-02-12 NOTE — Progress Notes (Signed)
 Mathew Wagner                                          MRN: [id number]   02/12/2024   The [facility] Quality Team Specialist reviewed this patient medical record for the purposes of chart review for care gap closure. The following were reviewed: chart review for care gap closure-colorectal cancer screening.    [facility] Quality Team

## 2024-02-26 ENCOUNTER — Other Ambulatory Visit: Payer: Self-pay | Admitting: Family Medicine

## 2024-02-26 DIAGNOSIS — E559 Vitamin D deficiency, unspecified: Secondary | ICD-10-CM

## 2024-03-02 ENCOUNTER — Other Ambulatory Visit: Payer: Self-pay | Admitting: Physician Assistant

## 2024-03-02 DIAGNOSIS — I10 Essential (primary) hypertension: Secondary | ICD-10-CM

## 2024-03-02 DIAGNOSIS — K219 Gastro-esophageal reflux disease without esophagitis: Secondary | ICD-10-CM

## 2024-03-10 ENCOUNTER — Ambulatory Visit: Admitting: Physician Assistant

## 2024-03-11 ENCOUNTER — Ambulatory Visit: Admitting: Physician Assistant

## 2024-03-11 ENCOUNTER — Other Ambulatory Visit: Payer: Self-pay | Admitting: Physician Assistant

## 2024-03-11 DIAGNOSIS — E038 Other specified hypothyroidism: Secondary | ICD-10-CM

## 2024-03-11 MED ORDER — LEVOTHYROXINE SODIUM 125 MCG PO TABS: 125.0000 ug | ORAL_TABLET | Freq: Every day | ORAL | 0 refills | Status: AC

## 2024-03-13 ENCOUNTER — Ambulatory Visit

## 2024-03-13 VITALS — BP 136/42 | Ht 72.0 in | Wt 209.0 lb

## 2024-03-13 DIAGNOSIS — Z0000 Encounter for general adult medical examination without abnormal findings: Secondary | ICD-10-CM

## 2024-03-13 NOTE — Progress Notes (Signed)
 I connected with  Mathew Wagner on 03/13/24 by a audio enabled telemedicine application and verified that I am speaking with the correct person using two identifiers.  Patient Location: Home  Provider Location: Home Office  Persons Participating in Visit: Patient.  I discussed the limitations of evaluation and management by telemedicine. The patient expressed understanding and agreed to proceed.  Vital Signs: Because this visit was a virtual/telehealth visit, some criteria may be missing or patient reported. Any vitals not documented were not able to be obtained and vitals that have been documented are patient reported.   Because this visit was a virtual/telehealth visit,  certain criteria was not obtained, such a blood pressure, CBG if applicable, and timed get up and go. Any medications not marked as taking were not mentioned during the medication reconciliation part of the visit. Any vitals not documented were not able to be obtained due to this being a telehealth visit or patient was unable to self-report a recent blood pressure reading due to a lack of equipment at home via telehealth. Vitals that have been documented are verbally provided by the patient.   This visit was performed by a medical professional under my direct supervision. I was immediately available for consultation/collaboration. I have reviewed and agree with the Annual Wellness Visit documentation.  Chief Complaint  Patient presents with   Medicare Wellness     Subjective:   Mathew Wagner is a 68 y.o. male who presents for a Medicare Annual Wellness Visit.  Allergies (verified) Nasal spray   History: Past Medical History:  Diagnosis Date   Allergy    Chronic kidney disease    Stones   GERD (gastroesophageal reflux disease)    Hiatal hernia    Hyperlipidemia    Hypertension    Substance abuse (HCC)    Thyroid  disease    Past Surgical History:  Procedure Laterality Date   BACK SURGERY     L4  and L5 possibly   COLONOSCOPY     Dr Luis- 2013   ESOPHAGOGASTRODUODENOSCOPY     Dr Charlanne- possibly 1990's   HEPARIN ASSOCIATED ANTIBODY DETECTION (CONVERTED LAB)     NECK SURGERY     1990's.    TONSILLECTOMY     Family History  Problem Relation Age of Onset   Diabetes Mother    Lung cancer Father    Lung cancer Sister    Stomach cancer Brother    Colon cancer Neg Hx    Esophageal cancer Neg Hx    Rectal cancer Neg Hx    Colon polyps Neg Hx    Social History   Occupational History   Occupation: Engineer, Water  Tobacco Use   Smoking status: Former    Types: Cigarettes   Smokeless tobacco: Never   Tobacco comments:    quit about 20 years ago  Vaping Use   Vaping status: Never Used  Substance and Sexual Activity   Alcohol use: Yes    Comment: rare   Drug use: Not Currently   Sexual activity: Yes   Tobacco Counseling Counseling given: Not Answered Tobacco comments: quit about 20 years ago  SDOH Screenings   Food Insecurity: No Food Insecurity (03/13/2024)  Housing: Low Risk  (03/13/2024)  Transportation Needs: No Transportation Needs (03/13/2024)  Utilities: Not At Risk (03/13/2024)  Alcohol Screen: Low Risk  (03/07/2023)  Depression (PHQ2-9): Low Risk  (03/13/2024)  Financial Resource Strain: Low Risk  (03/07/2023)  Physical Activity: Insufficiently Active (03/13/2024)  Social Connections:  Moderately Isolated (03/13/2024)  Stress: No Stress Concern Present (03/13/2024)  Tobacco Use: Medium Risk (03/13/2024)  Health Literacy: Adequate Health Literacy (03/13/2024)   See flowsheets for full screening details  Depression Screen PHQ 2 & 9 Depression Scale- Over the past 2 weeks, how often have you been bothered by any of the following problems? Little interest or pleasure in doing things: 0 Feeling down, depressed, or hopeless (PHQ Adolescent also includes...irritable): 1 PHQ-2 Total Score: 1 Trouble falling or staying asleep, or sleeping too much:  0 Feeling tired or having little energy: 2 Poor appetite or overeating (PHQ Adolescent also includes...weight loss): 0 Feeling bad about yourself - or that you are a failure or have let yourself or your family down: 0 Trouble concentrating on things, such as reading the newspaper or watching television (PHQ Adolescent also includes...like school work): 0 Moving or speaking so slowly that other people could have noticed. Or the opposite - being so fidgety or restless that you have been moving around a lot more than usual: 0 Thoughts that you would be better off dead, or of hurting yourself in some way: 0 PHQ-9 Total Score: 3 If you checked off any problems, how difficult have these problems made it for you to do your work, take care of things at home, or get along with other people?: Not difficult at all  Depression Treatment Depression Interventions/Treatment : EYV7-0 Score <4 Follow-up Not Indicated     Goals Addressed             This Visit's Progress    Patient Stated       To get more energy       Visit info / Clinical Intake: Medicare Wellness Visit Type:: Subsequent Annual Wellness Visit Persons participating in visit:: patient Medicare Wellness Visit Mode:: Telephone If telephone:: video declined Because this visit was a virtual/telehealth visit:: pt reported vitals If Telephone or Video please confirm:: I connected with the patient using audio enabled telemedicine application and verified that I am speaking with the correct person using two identifiers; I discussed the limitations of evaluation and management by telemedicine; The patient expressed understanding and agreed to proceed Patient Location:: home Provider Location:: home office Information given by:: patient Interpreter Needed?: No Pre-visit prep was completed: yes AWV questionnaire completed by patient prior to visit?: no Living arrangements:: lives with spouse/significant other Patient's Overall Health  Status Rating: very good Typical amount of pain: none Does pain affect daily life?: no Are you currently prescribed opioids?: no  Dietary Habits and Nutritional Risks How many meals a day?: 2 Eats fruit and vegetables daily?: yes Most meals are obtained by: having others provide food; preparing own meals; eating out In the last 2 weeks, have you had any of the following?: none Diabetic:: no  Functional Status Activities of Daily Living (to include ambulation/medication): Independent Ambulation: Independent Medication Administration: Independent Home Management: Independent Manage your own finances?: yes Primary transportation is: driving Concerns about vision?: no *vision screening is required for WTM* Concerns about hearing?: no  Fall Screening Falls in the past year?: 0 Number of falls in past year: 0 Was there an injury with Fall?: 0 Fall Risk Category Calculator: 0 Patient Fall Risk Level: Low Fall Risk  Fall Risk Patient at Risk for Falls Due to: No Fall Risks Fall risk Follow up: Falls evaluation completed; Falls prevention discussed  Home and Transportation Safety: All rugs have non-skid backing?: N/A, no rugs All stairs or steps have railings?: N/A, no stairs Grab  bars in the bathtub or shower?: (!) no Have non-skid surface in bathtub or shower?: yes Good home lighting?: yes Regular seat belt use?: yes Hospital stays in the last year:: no  Cognitive Assessment Difficulty concentrating, remembering, or making decisions? : no Will 6CIT or Mini Cog be Completed: no 6CIT or Mini Cog Declined: patient alert, oriented, able to answer questions appropriately and recall recent events  Advance Directives (For Healthcare) Does Patient Have a Medical Advance Directive?: Yes Does patient want to make changes to medical advance directive?: No - Patient declined Type of Advance Directive: Living will Copy of Living Will in Chart?: No - copy requested  Reviewed/Updated   Reviewed/Updated: Reviewed All (Medical, Surgical, Family, Medications, Allergies, Care Teams, Patient Goals)        Objective:    Today's Vitals   03/13/24 0904  BP: (!) 136/42  Weight: 209 lb (94.8 kg)  Height: 6' (1.829 m)   Body mass index is 28.35 kg/m.  Current Medications (verified) Outpatient Encounter Medications as of 03/13/2024  Medication Sig   amLODipine  (NORVASC ) 10 MG tablet TAKE 1 TABLET BY MOUTH EVERY DAY INSURANCE: 3/24   aspirin  EC 81 MG tablet Take 1 tablet (81 mg total) by mouth daily. Swallow whole.   atorvastatin  (LIPITOR) 10 MG tablet Take 10 mg by mouth daily.   hydrochlorothiazide  (HYDRODIURIL ) 25 MG tablet TAKE 1 TABLET BY MOUTH EVERY DAY FOR BLOOD PRESSURE   levothyroxine  (SYNTHROID ) 125 MCG tablet Take 1 tablet (125 mcg total) by mouth daily.   lisinopril  (ZESTRIL ) 10 MG tablet TAKE 1 TABLET BY MOUTH EVERY DAY   pantoprazole  (PROTONIX ) 40 MG tablet TAKE 1 TABLET BY MOUTH EVERY DAY   tiZANidine  (ZANAFLEX ) 2 MG tablet TAKE 1 TABLET BY MOUTH AT BEDTIME AS NEEDED FOR MUSCLE SPASMS.   Vitamin D , Ergocalciferol , (DRISDOL ) 1.25 MG (50000 UNIT) CAPS capsule TAKE 1 CAPSULE (50,000 UNITS TOTAL) BY MOUTH EVERY 7 (SEVEN) DAYS   No facility-administered encounter medications on file as of 03/13/2024.   Hearing/Vision screen Hearing Screening - Comments:: No difficulties Vision Screening - Comments:: Wears glasses Immunizations and Health Maintenance Health Maintenance  Topic Date Due   DTaP/Tdap/Td (1 - Tdap) Never done   Colonoscopy  Never done   DEXA SCAN  02/07/2022   Medicare Annual Wellness (AWV)  03/06/2024   Pneumococcal Vaccine: 50+ Years  Completed   Influenza Vaccine  Completed   Hepatitis C Screening  Completed   Zoster Vaccines- Shingrix  Completed   Meningococcal B Vaccine  Aged Out   COVID-19 Vaccine  Discontinued        Assessment/Plan:  This is a routine wellness examination for [place].  Patient Care Team: Nicholaus Credit, DEVONNA as  PCP - General (Physician Assistant) Charlanne Groom, MD as Consulting Physician (Gastroenterology)  I have personally reviewed and noted the following in the patient's chart:   Medical and social history Use of alcohol, tobacco or illicit drugs  Current medications and supplements including opioid prescriptions. Functional ability and status Nutritional status Physical activity Advanced directives List of other physicians Hospitalizations, surgeries, and ER visits in previous 12 months Vitals Screenings to include cognitive, depression, and falls Referrals and appointments  No orders of the defined types were placed in this encounter.  In addition, I have reviewed and discussed with patient certain preventive protocols, quality metrics, and best practice recommendations. A written personalized care plan for preventive services as well as general preventive health recommendations were provided to patient.   Lyle MARLA Right, CMA  03/13/2024   No follow-ups on file.  After Visit Summary: (MyChart) Due to this being a telephonic visit, the after visit summary with patients personalized plan was offered to patient via MyChart   Nurse Notes: nothing to report

## 2024-03-13 NOTE — Patient Instructions (Signed)
 Mathew Wagner,  Thank you for taking the time for your Medicare Wellness Visit. I appreciate your continued commitment to your health goals. Please review the care plan we discussed, and feel free to reach out if I can assist you further.  Please note that Annual Wellness Visits do not include a physical exam. Some assessments may be limited, especially if the visit was conducted virtually. If needed, we may recommend an in-person follow-up with your provider.  Ongoing Care Seeing your primary care provider every 3 to 6 months helps us  monitor your health and provide consistent, personalized care.  Referrals If a referral was made during today's visit and you haven't received any updates within two weeks, please contact the referred provider directly to check on the status.  Recommended Screenings:  Health Maintenance  Topic Date Due   DTaP/Tdap/Td vaccine (1 - Tdap) Never done   Colon Cancer Screening  Never done   DEXA scan (bone density measurement)  02/07/2022   Medicare Annual Wellness Visit  03/06/2024   Pneumococcal Vaccine for age over 29  Completed   Flu Shot  Completed   Hepatitis C Screening  Completed   Zoster (Shingles) Vaccine  Completed   Meningitis B Vaccine  Aged Out   COVID-19 Vaccine  Discontinued       03/13/2024    9:08 AM  Advanced Directives  Does Patient Have a Medical Advance Directive? Yes  Type of Advance Directive Living will  Does patient want to make changes to medical advance directive? No - Patient declined    Vision: Annual vision screenings are recommended for early detection of glaucoma, cataracts, and diabetic retinopathy. These exams can also reveal signs of chronic conditions such as diabetes and high blood pressure.  Dental: Annual dental screenings help detect early signs of oral cancer, gum disease, and other conditions linked to overall health, including heart disease and diabetes.  Please see the attached documents for additional  preventive care recommendations.

## 2024-03-13 NOTE — Progress Notes (Signed)
   03/13/2024  Patient ID: Mathew Wagner, male   DOB: 08-15-1955, 68 y.o.   MRN: [id number]  Pharmacy Quality Measure Review  This patient is appearing on a report for being at risk of failing the adherence measure for hypertension (ACEi/ARB) medications this calendar year.   Medication: lisinopril  Last fill date: 03/03/24 for 90 day supply  Insurance report was not up to date. No action needed at this time.   Lang Sieve, PharmD, BCGP Clinical Pharmacist  [phone]

## 2024-03-24 ENCOUNTER — Ambulatory Visit: Admitting: Physician Assistant

## 2024-03-26 ENCOUNTER — Ambulatory Visit (INDEPENDENT_AMBULATORY_CARE_PROVIDER_SITE_OTHER): Admitting: Physician Assistant

## 2024-03-26 ENCOUNTER — Encounter: Payer: Self-pay | Admitting: Physician Assistant

## 2024-03-26 VITALS — BP 128/80 | HR 73 | Temp 98.1°F | Resp 18 | Ht 72.0 in | Wt 214.2 lb

## 2024-03-26 DIAGNOSIS — R1314 Dysphagia, pharyngoesophageal phase: Secondary | ICD-10-CM

## 2024-03-26 DIAGNOSIS — G8929 Other chronic pain: Secondary | ICD-10-CM

## 2024-03-26 DIAGNOSIS — I10 Essential (primary) hypertension: Secondary | ICD-10-CM

## 2024-03-26 DIAGNOSIS — E782 Mixed hyperlipidemia: Secondary | ICD-10-CM

## 2024-03-26 DIAGNOSIS — K219 Gastro-esophageal reflux disease without esophagitis: Secondary | ICD-10-CM | POA: Diagnosis not present

## 2024-03-26 DIAGNOSIS — E038 Other specified hypothyroidism: Secondary | ICD-10-CM

## 2024-03-26 DIAGNOSIS — M545 Low back pain, unspecified: Secondary | ICD-10-CM

## 2024-03-26 DIAGNOSIS — E559 Vitamin D deficiency, unspecified: Secondary | ICD-10-CM | POA: Diagnosis not present

## 2024-03-26 DIAGNOSIS — J3489 Other specified disorders of nose and nasal sinuses: Secondary | ICD-10-CM

## 2024-03-26 DIAGNOSIS — R7303 Prediabetes: Secondary | ICD-10-CM

## 2024-03-26 DIAGNOSIS — M8580 Other specified disorders of bone density and structure, unspecified site: Secondary | ICD-10-CM

## 2024-03-26 MED ORDER — VITAMIN D (ERGOCALCIFEROL) 1.25 MG (50000 UNIT) PO CAPS: 50000.0000 [IU] | ORAL_CAPSULE | ORAL | 1 refills | Status: AC

## 2024-03-26 NOTE — Progress Notes (Signed)
 Subjective:  Patient ID: Mathew Wagner, male    DOB: 1955/11/12  Age: 68 y.o. MRN: [id number]  Chief Complaint  Patient presents with   Medical Management of Chronic Issues    HPI  Pt presents for follow up of hypertension. The patient is tolerating the medication well without side effects. Compliance with treatment has been good; including taking medication as directed , maintains a healthy diet and regular exercise regimen , and following up as directed. Pt is currently on norvasc  10mg  qd, hctz 25mg  qd, zestril  10mg  qd Denies chest pain/dyspnea  Pt with history of GERD  and states protonix  had been working well for him however more recently he has started having trouble swallowing - would like referral to dr Charlanne for evaluation and is due for colonoscopy  Pt with history of thyroid  disease - currently on synthroid  125 mcg qd - due for labwork  Pt with history of vit D def - is taking weekly supplement --- due for labwork  Pt with history of hyperlipidemia - he states he is taking lipitor 10mg  qd and trying to watch diet  Pt with history of chronic low back pain - had surgery on lumbar spine several years ago --he uses ibuprofen and also muscle relaxant as needed.  He would like referral to ortho (new provider) for further evaluation and treatment  Pt has lesion inside of right nares - at times bleeds and itches - states seems to be getting bigger Current Outpatient Medications on File Prior to Visit  Medication Sig Dispense Refill   amLODipine  (NORVASC ) 10 MG tablet TAKE 1 TABLET BY MOUTH EVERY DAY INSURANCE: 3/24 90 tablet 0   aspirin  EC 81 MG tablet Take 1 tablet (81 mg total) by mouth daily. Swallow whole. 90 tablet 3   atorvastatin  (LIPITOR) 10 MG tablet Take 10 mg by mouth daily.     hydrochlorothiazide  (HYDRODIURIL ) 25 MG tablet TAKE 1 TABLET BY MOUTH EVERY DAY FOR BLOOD PRESSURE 90 tablet 0   levothyroxine  (SYNTHROID ) 125 MCG tablet Take 1 tablet (125 mcg total) by  mouth daily. 90 tablet 0   lisinopril  (ZESTRIL ) 10 MG tablet TAKE 1 TABLET BY MOUTH EVERY DAY 90 tablet 0   pantoprazole  (PROTONIX ) 40 MG tablet TAKE 1 TABLET BY MOUTH EVERY DAY 90 tablet 0   tiZANidine  (ZANAFLEX ) 2 MG tablet TAKE 1 TABLET BY MOUTH AT BEDTIME AS NEEDED FOR MUSCLE SPASMS. 90 tablet 0   No current facility-administered medications on file prior to visit.   Past Medical History:  Diagnosis Date   Allergy    Chronic kidney disease    Stones   GERD (gastroesophageal reflux disease)    Hiatal hernia    Hyperlipidemia    Hypertension    Substance abuse (HCC)    Thyroid  disease    Past Surgical History:  Procedure Laterality Date   BACK SURGERY     L4 and L5 possibly   COLONOSCOPY     Dr Luis- 2013   ESOPHAGOGASTRODUODENOSCOPY     Dr Charlanne- possibly 1990's   HEPARIN ASSOCIATED ANTIBODY DETECTION (CONVERTED LAB)     NECK SURGERY     1990's.    TONSILLECTOMY      Family History  Problem Relation Age of Onset   Diabetes Mother    Lung cancer Father    Lung cancer Sister    Stomach cancer Brother    Colon cancer Neg Hx    Esophageal cancer Neg Hx    Rectal cancer  Neg Hx    Colon polyps Neg Hx    Social History   Socioeconomic History   Marital status: Married    Spouse name: Arland   Number of children: 3   Years of education: Not on file   Highest education level: GED or equivalent  Occupational History   Occupation: Engineer, Water  Tobacco Use   Smoking status: Former    Types: Cigarettes   Smokeless tobacco: Never   Tobacco comments:    quit about 20 years ago  Vaping Use   Vaping status: Never Used  Substance and Sexual Activity   Alcohol use: Yes    Comment: rare   Drug use: Not Currently   Sexual activity: Yes  Other Topics Concern   Not on file  Social History Narrative   Not on file   Social Drivers of Health   Financial Resource Strain: Low Risk  (03/25/2024)   Overall Financial Resource Strain (CARDIA)    Difficulty  of Paying Living Expenses: Not hard at all  Food Insecurity: Food Insecurity Present (03/25/2024)   Hunger Vital Sign    Worried About Running Out of Food in the Last Year: Sometimes true    Ran Out of Food in the Last Year: Sometimes true  Transportation Needs: Unmet Transportation Needs (03/25/2024)   PRAPARE - Transportation    Lack of Transportation (Medical): Yes    Lack of Transportation (Non-Medical): Yes  Physical Activity: Insufficiently Active (03/25/2024)   Exercise Vital Sign    Days of Exercise per Week: 5 days    Minutes of Exercise per Session: 20 min  Stress: No Stress Concern Present (03/25/2024)   [employer] of Occupational Health - Occupational Stress Questionnaire    Feeling of Stress: Only a little  Social Connections: Moderately Isolated (03/25/2024)   Social Connection and Isolation Panel    Frequency of Communication with Friends and Family: More than three times a week    Frequency of Social Gatherings with Friends and Family: More than three times a week    Attends Religious Services: Never    Database Administrator or Organizations: No    Attends Engineer, Structural: Not on file    Marital Status: Married    CONSTITUTIONAL: Negative for chills, fatigue, fever, unintentional weight gain and unintentional weight loss.  E/N/T: Negative for ear pain, nasal congestion and sore throat.  CARDIOVASCULAR: Negative for chest pain, dizziness, palpitations and pedal edema.  RESPIRATORY: Negative for recent cough and dyspnea.  GASTROINTESTINAL: see HPI MSK: see HPI INTEGUMENTARY: see HPI NEUROLOGICAL: Negative for dizziness and headaches.  PSYCHIATRIC: Negative for sleep disturbance and to question depression screen.  Negative for depression, negative for anhedonia.       Objective:  PHYSICAL EXAM:   VS: BP 128/80   Pulse 73   Temp 98.1 F (36.7 C) (Temporal)   Resp 18   Ht 6' (1.829 m)   Wt 214 lb 3.2 oz (97.2 kg)   SpO2 97%   BMI 29.05  kg/m   GEN: Well nourished, well developed, in no acute distress  HEENT: normal external ears and nose - normal external auditory canals and TMS - l -pedunculated lesion noted in right nares- Lips, Teeth and Gums - normal  Oropharynx - normal mucosa, palate, and posterior pharynx Cardiac: RRR; no murmurs, rubs, or gallops,no edema - Respiratory:  normal respiratory rate and pattern with no distress - normal breath sounds with no rales, rhonchi, wheezes or rubs GI: normal  bowel sounds, no masses or tenderness MS: no deformity or atrophy  Skin: warm and dry, no rash  Neuro:  Alert and Oriented x 3, - CN II-Xii grossly intact Psych: euthymic mood, appropriate affect and demeanor   Lab Results  Component Value Date   WBC 6.6 08/31/2023   HGB 13.7 08/31/2023   HCT 41.7 08/31/2023   PLT 252 08/31/2023   GLUCOSE 101 (H) 08/31/2023   CHOL 167 08/31/2023   TRIG 111 08/31/2023   HDL 32 (L) 08/31/2023   LDLCALC 115 (H) 08/31/2023   ALT 25 08/31/2023   AST 26 08/31/2023   NA 143 08/31/2023   K 3.8 08/31/2023   CL 101 08/31/2023   CREATININE 0.95 08/31/2023   BUN 18 08/31/2023   CO2 26 08/31/2023   TSH 1.020 11/01/2023   HGBA1C 5.8 (H) 08/31/2023      Assessment & Plan:   Problem List Items Addressed This Visit       Cardiovascular and Mediastinum   Essential hypertension, benign - Primary   Relevant Orders   CBC with Differential/Platelet   Comprehensive metabolic panel   Lipid panel    Continue current meds     Digestive   GERD without esophagitis   Relevant Medications   Continue protonix   Dysphagia Refer to Dr Charlanne           Endocrine   Other specified hypothyroidism   Relevant Orders   TSH Continue synthroid      Musculoskeletal and Integument   Osteopenia   Relevant Orders   VITAMIN D  25 Hydroxy (Vit-D Deficiency, Fractures) Pt defers dexa Recommend multivitamin Continue vitamin D      Other   Chronic low back pain Continue meds Refer to  ortho  Vit D def Vit D level  Continue med  Hyperlipidemia Watch diet Continue lipitor Labwork pending  Nasal lesion Refer to ENT  Prediabetes Labwork pending Continue to watch diet                          .  Meds ordered this encounter  Medications   Vitamin D , Ergocalciferol , (DRISDOL ) 1.25 MG (50000 UNIT) CAPS capsule    Sig: Take 1 capsule (50,000 Units total) by mouth every 7 (seven) days.    Dispense:  12 capsule    Refill:  1    Supervising Provider:   SHERRE CLAPPER [phone]    Orders Placed This Encounter  Procedures   CBC with Differential/Platelet   Comprehensive metabolic panel with GFR   Lipid panel   Hemoglobin A1c   VITAMIN D  25 Hydroxy (Vit-D Deficiency, Fractures)   TSH     Follow-up: Return in about 6 months (around 09/23/2024) for chronic fasting follow-up.  An After Visit Summary was printed and given to the patient.  CAMIE JONELLE NICHOLAUS DEVONNA [facility] [phone]

## 2024-03-27 LAB — CBC WITH DIFFERENTIAL/PLATELET
Basophils Absolute: 0 x10E3/uL (ref 0.0–0.2)
Basos: 1 %
EOS (ABSOLUTE): 0.4 x10E3/uL (ref 0.0–0.4)
Eos: 5 %
Hematocrit: 42.7 % (ref 37.5–51.0)
Hemoglobin: 13.9 g/dL (ref 13.0–17.7)
Immature Grans (Abs): 0 x10E3/uL (ref 0.0–0.1)
Immature Granulocytes: 0 %
Lymphocytes Absolute: 2.3 x10E3/uL (ref 0.7–3.1)
Lymphs: 34 %
MCH: 31.3 pg (ref 26.6–33.0)
MCHC: 32.6 g/dL (ref 31.5–35.7)
MCV: 96 fL (ref 79–97)
Monocytes Absolute: 0.4 x10E3/uL (ref 0.1–0.9)
Monocytes: 6 %
Neutrophils Absolute: 3.7 x10E3/uL (ref 1.4–7.0)
Neutrophils: 54 %
Platelets: 259 x10E3/uL (ref 150–450)
RBC: 4.44 x10E6/uL (ref 4.14–5.80)
RDW: 12.8 % (ref 11.6–15.4)
WBC: 6.9 x10E3/uL (ref 3.4–10.8)

## 2024-03-27 LAB — COMPREHENSIVE METABOLIC PANEL WITH GFR
ALT: 32 IU/L (ref 0–44)
AST: 27 IU/L (ref 0–40)
Albumin: 4.4 g/dL (ref 3.9–4.9)
Alkaline Phosphatase: 87 IU/L (ref 47–123)
BUN/Creatinine Ratio: 19 (ref 10–24)
BUN: 17 mg/dL (ref 8–27)
Bilirubin Total: 0.6 mg/dL (ref 0.0–1.2)
CO2: 28 mmol/L (ref 20–29)
Calcium: 9.7 mg/dL (ref 8.6–10.2)
Chloride: 100 mmol/L (ref 96–106)
Creatinine, Ser: 0.9 mg/dL (ref 0.76–1.27)
Globulin, Total: 2.1 g/dL (ref 1.5–4.5)
Glucose: 95 mg/dL (ref 70–99)
Potassium: 4.2 mmol/L (ref 3.5–5.2)
Sodium: 142 mmol/L (ref 134–144)
Total Protein: 6.5 g/dL (ref 6.0–8.5)
eGFR: 92 mL/min/1.73 (ref >59)

## 2024-03-27 LAB — HEMOGLOBIN A1C
Est. average glucose Bld gHb Est-mCnc: 123 mg/dL
Hgb A1c MFr Bld: 5.9 % — ABNORMAL HIGH (ref 4.8–5.6)

## 2024-03-27 LAB — LIPID PANEL
Chol/HDL Ratio: 4.9 ratio (ref 0.0–5.0)
Cholesterol, Total: 176 mg/dL (ref 100–199)
HDL: 36 mg/dL — ABNORMAL LOW (ref >39)
LDL Chol Calc (NIH): 118 mg/dL — ABNORMAL HIGH (ref 0–99)
Triglycerides: 118 mg/dL (ref 0–149)
VLDL Cholesterol Cal: 22 mg/dL (ref 5–40)

## 2024-03-27 LAB — VITAMIN D 25 HYDROXY (VIT D DEFICIENCY, FRACTURES): Vit D, 25-Hydroxy: 51.5 ng/mL (ref 30.0–100.0)

## 2024-03-27 LAB — TSH: TSH: 4.59 u[IU]/mL — ABNORMAL HIGH (ref 0.450–4.500)

## 2024-03-28 ENCOUNTER — Ambulatory Visit: Payer: Self-pay | Admitting: Physician Assistant

## 2024-05-05 ENCOUNTER — Ambulatory Visit: Admitting: Physical Medicine and Rehabilitation

## 2024-06-01 ENCOUNTER — Other Ambulatory Visit: Payer: Self-pay | Admitting: Physician Assistant

## 2024-06-01 DIAGNOSIS — I10 Essential (primary) hypertension: Secondary | ICD-10-CM

## 2024-06-05 ENCOUNTER — Other Ambulatory Visit: Payer: Self-pay | Admitting: Physician Assistant

## 2024-06-05 DIAGNOSIS — I10 Essential (primary) hypertension: Secondary | ICD-10-CM

## 2024-06-05 DIAGNOSIS — K219 Gastro-esophageal reflux disease without esophagitis: Secondary | ICD-10-CM

## 2024-09-24 ENCOUNTER — Ambulatory Visit: Admitting: Physician Assistant
